# Patient Record
Sex: Female | Born: 1985 | Race: White | Hispanic: Yes | Marital: Married | Smoking: Never smoker
Health system: Southern US, Community
[De-identification: ages and names within clinical notes are randomized; demographics above are authoritative.]

## PROBLEM LIST (undated history)

## (undated) DIAGNOSIS — Z789 Other specified health status: Secondary | ICD-10-CM

## (undated) DIAGNOSIS — G510 Bell's palsy: Secondary | ICD-10-CM

## (undated) DIAGNOSIS — R519 Headache, unspecified: Secondary | ICD-10-CM

## (undated) HISTORY — PX: OTHER SURGICAL HISTORY: SHX169

## (undated) HISTORY — DX: Bell's palsy: G51.0

## (undated) HISTORY — PX: NO PAST SURGERIES: SHX2092

---

## 2010-05-20 ENCOUNTER — Ambulatory Visit (HOSPITAL_COMMUNITY): Admission: RE | Admit: 2010-05-20 | Discharge: 2010-05-20 | Payer: Self-pay | Admitting: Family Medicine

## 2010-09-19 ENCOUNTER — Inpatient Hospital Stay (HOSPITAL_COMMUNITY)
Admission: AD | Admit: 2010-09-19 | Discharge: 2010-09-20 | Payer: Self-pay | Source: Home / Self Care | Attending: Obstetrics & Gynecology | Admitting: Obstetrics & Gynecology

## 2010-09-29 LAB — URINE MICROSCOPIC-ADD ON

## 2010-09-29 LAB — URINALYSIS, ROUTINE W REFLEX MICROSCOPIC
Bilirubin Urine: NEGATIVE
Ketones, ur: NEGATIVE mg/dL
Nitrite: NEGATIVE
Protein, ur: NEGATIVE mg/dL
Specific Gravity, Urine: 1.01 (ref 1.005–1.030)
Urine Glucose, Fasting: NEGATIVE mg/dL
Urobilinogen, UA: 0.2 mg/dL (ref 0.0–1.0)
pH: 7 (ref 5.0–8.0)

## 2010-10-03 ENCOUNTER — Ambulatory Visit (HOSPITAL_COMMUNITY)
Admission: RE | Admit: 2010-10-03 | Discharge: 2010-10-03 | Payer: Self-pay | Source: Home / Self Care | Attending: Family Medicine | Admitting: Family Medicine

## 2010-10-11 ENCOUNTER — Inpatient Hospital Stay (HOSPITAL_COMMUNITY)
Admission: AD | Admit: 2010-10-11 | Discharge: 2010-10-14 | Payer: Self-pay | Source: Home / Self Care | Attending: Obstetrics & Gynecology | Admitting: Obstetrics & Gynecology

## 2010-10-11 LAB — CBC
HCT: 34.8 % — ABNORMAL LOW (ref 36.0–46.0)
Hemoglobin: 11.7 g/dL — ABNORMAL LOW (ref 12.0–15.0)
MCH: 28.6 pg (ref 26.0–34.0)
MCHC: 33.6 g/dL (ref 30.0–36.0)
MCV: 85.1 fL (ref 78.0–100.0)
Platelets: 112 10*3/uL — ABNORMAL LOW (ref 150–400)
RBC: 4.09 MIL/uL (ref 3.87–5.11)
RDW: 13.8 % (ref 11.5–15.5)
WBC: 6.6 10*3/uL (ref 4.0–10.5)

## 2010-10-12 LAB — ABO/RH: ABO/RH(D): O POS

## 2010-10-12 LAB — RPR: RPR Ser Ql: NONREACTIVE

## 2010-10-26 ENCOUNTER — Emergency Department (HOSPITAL_COMMUNITY)
Admission: EM | Admit: 2010-10-26 | Discharge: 2010-10-27 | Disposition: A | Discharge status: Home / Self Care | Payer: Self-pay | Attending: Emergency Medicine | Admitting: Emergency Medicine

## 2010-10-26 DIAGNOSIS — N390 Urinary tract infection, site not specified: Secondary | ICD-10-CM | POA: Insufficient documentation

## 2010-10-26 DIAGNOSIS — R1013 Epigastric pain: Secondary | ICD-10-CM | POA: Insufficient documentation

## 2010-10-26 DIAGNOSIS — R52 Pain, unspecified: Secondary | ICD-10-CM | POA: Insufficient documentation

## 2010-10-26 DIAGNOSIS — R112 Nausea with vomiting, unspecified: Secondary | ICD-10-CM | POA: Insufficient documentation

## 2010-10-26 DIAGNOSIS — R079 Chest pain, unspecified: Secondary | ICD-10-CM | POA: Insufficient documentation

## 2010-10-26 DIAGNOSIS — R10816 Epigastric abdominal tenderness: Secondary | ICD-10-CM | POA: Insufficient documentation

## 2010-10-26 DIAGNOSIS — M546 Pain in thoracic spine: Secondary | ICD-10-CM | POA: Insufficient documentation

## 2010-10-27 LAB — URINALYSIS, ROUTINE W REFLEX MICROSCOPIC
Bilirubin Urine: NEGATIVE
Ketones, ur: NEGATIVE mg/dL
Nitrite: NEGATIVE
Protein, ur: NEGATIVE mg/dL
Specific Gravity, Urine: 1.017 (ref 1.005–1.030)
Urine Glucose, Fasting: NEGATIVE mg/dL
Urobilinogen, UA: 1 mg/dL (ref 0.0–1.0)
pH: 7.5 (ref 5.0–8.0)

## 2010-10-27 LAB — BASIC METABOLIC PANEL
BUN: 13 mg/dL (ref 6–23)
CO2: 24 mEq/L (ref 19–32)
Calcium: 8.4 mg/dL (ref 8.4–10.5)
Chloride: 105 mEq/L (ref 96–112)
Creatinine, Ser: 0.58 mg/dL (ref 0.4–1.2)
GFR calc Af Amer: >60 mL/min (ref >60)
GFR calc non Af Amer: >60 mL/min (ref >60)
Glucose, Bld: 111 mg/dL — ABNORMAL HIGH (ref 70–99)
Potassium: 3.5 mEq/L (ref 3.5–5.1)
Sodium: 138 mEq/L (ref 135–145)

## 2010-10-27 LAB — HEPATIC FUNCTION PANEL
ALT: 40 U/L — ABNORMAL HIGH (ref 0–35)
AST: 84 U/L — ABNORMAL HIGH (ref 0–37)
Albumin: 3.3 g/dL — ABNORMAL LOW (ref 3.5–5.2)
Alkaline Phosphatase: 111 U/L (ref 39–117)
Bilirubin, Direct: 0.2 mg/dL (ref 0.0–0.3)
Indirect Bilirubin: 0.2 mg/dL — ABNORMAL LOW (ref 0.3–0.9)
Total Bilirubin: 0.4 mg/dL (ref 0.3–1.2)
Total Protein: 6.7 g/dL (ref 6.0–8.3)

## 2010-10-27 LAB — DIFFERENTIAL
Basophils Absolute: 0 10*3/uL (ref 0.0–0.1)
Basophils Relative: 0 % (ref 0–1)
Eosinophils Absolute: 0.1 10*3/uL (ref 0.0–0.7)
Eosinophils Relative: 1 % (ref 0–5)
Lymphocytes Relative: 13 % (ref 12–46)
Lymphs Abs: 1.5 10*3/uL (ref 0.7–4.0)
Monocytes Absolute: 0.5 10*3/uL (ref 0.1–1.0)
Monocytes Relative: 4 % (ref 3–12)
Neutro Abs: 9.1 10*3/uL — ABNORMAL HIGH (ref 1.7–7.7)
Neutrophils Relative %: 81 % — ABNORMAL HIGH (ref 43–77)

## 2010-10-27 LAB — CBC
HCT: 37.2 % (ref 36.0–46.0)
Hemoglobin: 12.5 g/dL (ref 12.0–15.0)
MCH: 28.7 pg (ref 26.0–34.0)
MCHC: 33.6 g/dL (ref 30.0–36.0)
MCV: 85.5 fL (ref 78.0–100.0)
Platelets: 162 10*3/uL (ref 150–400)
RBC: 4.35 MIL/uL (ref 3.87–5.11)
RDW: 13.2 % (ref 11.5–15.5)
WBC: 11.2 10*3/uL — ABNORMAL HIGH (ref 4.0–10.5)

## 2010-10-27 LAB — URINE MICROSCOPIC-ADD ON

## 2010-10-27 LAB — LIPASE, BLOOD: Lipase: 39 U/L (ref 11–59)

## 2010-10-27 LAB — PREGNANCY, URINE: Preg Test, Ur: NEGATIVE

## 2010-10-27 LAB — D-DIMER, QUANTITATIVE: D-Dimer, Quant: 0.28 ug/mL-FEU (ref 0.00–0.48)

## 2010-12-23 ENCOUNTER — Emergency Department (HOSPITAL_COMMUNITY): Payer: Self-pay

## 2010-12-23 ENCOUNTER — Emergency Department (HOSPITAL_COMMUNITY)
Admission: EM | Admit: 2010-12-23 | Discharge: 2010-12-23 | Disposition: A | Discharge status: Home / Self Care | Payer: Self-pay | Attending: Emergency Medicine | Admitting: Emergency Medicine

## 2010-12-23 ENCOUNTER — Encounter (HOSPITAL_COMMUNITY): Payer: Self-pay | Admitting: Radiology

## 2010-12-23 DIAGNOSIS — R112 Nausea with vomiting, unspecified: Secondary | ICD-10-CM | POA: Insufficient documentation

## 2010-12-23 DIAGNOSIS — R1011 Right upper quadrant pain: Secondary | ICD-10-CM | POA: Insufficient documentation

## 2010-12-23 DIAGNOSIS — M549 Dorsalgia, unspecified: Secondary | ICD-10-CM | POA: Insufficient documentation

## 2010-12-23 DIAGNOSIS — R799 Abnormal finding of blood chemistry, unspecified: Secondary | ICD-10-CM | POA: Insufficient documentation

## 2010-12-23 LAB — URINALYSIS, ROUTINE W REFLEX MICROSCOPIC
Bilirubin Urine: NEGATIVE
Glucose, UA: NEGATIVE mg/dL
Hgb urine dipstick: NEGATIVE
Ketones, ur: NEGATIVE mg/dL
Nitrite: NEGATIVE
Protein, ur: NEGATIVE mg/dL
Specific Gravity, Urine: 1.021 (ref 1.005–1.030)
Urobilinogen, UA: 1 mg/dL (ref 0.0–1.0)
pH: 8 (ref 5.0–8.0)

## 2010-12-23 LAB — COMPREHENSIVE METABOLIC PANEL
ALT: 200 U/L — ABNORMAL HIGH (ref 0–35)
AST: 384 U/L — ABNORMAL HIGH (ref 0–37)
Albumin: 4 g/dL (ref 3.5–5.2)
Alkaline Phosphatase: 103 U/L (ref 39–117)
BUN: 15 mg/dL (ref 6–23)
CO2: 24 mEq/L (ref 19–32)
Calcium: 9 mg/dL (ref 8.4–10.5)
Chloride: 106 mEq/L (ref 96–112)
Creatinine, Ser: 0.45 mg/dL (ref 0.4–1.2)
GFR calc Af Amer: >60 mL/min (ref >60)
GFR calc non Af Amer: >60 mL/min (ref >60)
Glucose, Bld: 95 mg/dL (ref 70–99)
Potassium: 3.9 mEq/L (ref 3.5–5.1)
Sodium: 135 mEq/L (ref 135–145)
Total Bilirubin: 0.7 mg/dL (ref 0.3–1.2)
Total Protein: 7.8 g/dL (ref 6.0–8.3)

## 2010-12-23 LAB — CBC
HCT: 35.6 % — ABNORMAL LOW (ref 36.0–46.0)
Hemoglobin: 11.9 g/dL — ABNORMAL LOW (ref 12.0–15.0)
MCH: 27.8 pg (ref 26.0–34.0)
MCHC: 33.4 g/dL (ref 30.0–36.0)
MCV: 83.2 fL (ref 78.0–100.0)
Platelets: 174 10*3/uL (ref 150–400)
RBC: 4.28 MIL/uL (ref 3.87–5.11)
RDW: 12.9 % (ref 11.5–15.5)
WBC: 6.1 10*3/uL (ref 4.0–10.5)

## 2010-12-23 LAB — DIFFERENTIAL
Basophils Absolute: 0 10*3/uL (ref 0.0–0.1)
Basophils Relative: 0 % (ref 0–1)
Eosinophils Absolute: 0 10*3/uL (ref 0.0–0.7)
Eosinophils Relative: 1 % (ref 0–5)
Lymphocytes Relative: 31 % (ref 12–46)
Lymphs Abs: 1.9 10*3/uL (ref 0.7–4.0)
Monocytes Absolute: 0.4 10*3/uL (ref 0.1–1.0)
Monocytes Relative: 7 % (ref 3–12)
Neutro Abs: 3.7 10*3/uL (ref 1.7–7.7)
Neutrophils Relative %: 61 % (ref 43–77)

## 2010-12-23 LAB — URINE MICROSCOPIC-ADD ON

## 2010-12-23 LAB — POCT PREGNANCY, URINE: Preg Test, Ur: NEGATIVE

## 2010-12-23 LAB — LIPASE, BLOOD: Lipase: 29 U/L (ref 11–59)

## 2010-12-23 MED ORDER — IOHEXOL 300 MG/ML  SOLN
95.0000 mL | Freq: Once | INTRAMUSCULAR | Status: AC | PRN
  Administered 2010-12-23: 95 mL via INTRAVENOUS

## 2010-12-25 ENCOUNTER — Other Ambulatory Visit: Payer: Medicaid Other

## 2010-12-25 ENCOUNTER — Other Ambulatory Visit: Payer: Self-pay | Admitting: Obstetrics & Gynecology

## 2010-12-25 DIAGNOSIS — Z0189 Encounter for other specified special examinations: Secondary | ICD-10-CM

## 2010-12-25 LAB — POCT URINALYSIS DIP (DEVICE)
Glucose, UA: NEGATIVE mg/dL
Nitrite: NEGATIVE
Urobilinogen, UA: 4 mg/dL — ABNORMAL HIGH (ref 0.0–1.0)

## 2011-01-23 ENCOUNTER — Ambulatory Visit (INDEPENDENT_AMBULATORY_CARE_PROVIDER_SITE_OTHER): Payer: Medicaid Other | Admitting: Family Medicine

## 2011-01-23 ENCOUNTER — Encounter: Payer: Self-pay | Admitting: Family Medicine

## 2011-01-23 DIAGNOSIS — R7401 Elevation of levels of liver transaminase levels: Secondary | ICD-10-CM | POA: Insufficient documentation

## 2011-01-23 DIAGNOSIS — R1013 Epigastric pain: Secondary | ICD-10-CM

## 2011-01-23 LAB — POCT H PYLORI SCREEN: H Pylori Screen, POC: POSITIVE

## 2011-01-23 MED ORDER — OMEPRAZOLE 20 MG PO CPDR: 20.0000 mg | DELAYED_RELEASE_CAPSULE | Freq: Every day | ORAL | Status: DC

## 2011-01-23 NOTE — Assessment & Plan Note (Signed)
Problem noted several months ago. Will check viral hepatitis labs, transferrin, and repeat LFTs. Abdominal US was negative for hepatic abnormalities. Possibly related to medication, pregnancy, steatosis. Will trend values today and consider further testing if not improved.

## 2011-01-23 NOTE — Assessment & Plan Note (Signed)
Possible GERD vs gastritis vs ulcer disease given chronic nature and early satiety. Extensive abdominal imaging and lipase were normal in ED, making pancreatic inflammation and gallstones less likely despite transient transaminitis. No weight loss, hematemesis or other red flag symptoms. Will test for H. Pylori and treat symptomatically with PPI as patient has not tried any medication thus far. Will use agents safe with lactation.

## 2011-01-23 NOTE — Patient Instructions (Signed)
Nice to see you again.  I will call you if your labs are abnormal. Try taking omeprazole once daily. You may also try tums from the drugstore for your stomach pain. Make an appointment if your symptoms get worse or do not improve.

## 2011-01-23 NOTE — Progress Notes (Signed)
  Subjective:    Patient ID: Roberta Nelson, female    DOB: 17-Mar-1986, 25 y.o.   MRN: [id number]  HPI New patient referred from [facility] due to persistent elevated LFTs and glucose intolerance. Gave birth January 2012.  1. Epigastric pain. Present since before pregnancy. Radiates to back, not present constantly or everyday. Intermittent. Not associated with meals, but does endorse early satiety. Had emesis one time, nonbloody. Records and labs reviewed: Presented to ED on 12/23/10 for this pain and had elevated LFTs but normal CBC and UA. Normal abdominal US, CT abdominal/pelvis and chest XR all negative except for significant stool burden. No signs biliary or hepatic abnormality.   2. Glucose intolerance. No history of diabetes. Forwarded medical record refers to elevated fasting blood glucose; however I see a recent fasting level of 96. No fam hx or personal hx of diabetes. Likely this has resolved postpartum.  3. Elevated LFTs. On prenatal testing, mildly elevated AST and ALT (84, 40). Was seen in ED 12/23/10 for right back and abdominal pain and CMP showed AST 384 and ALT 200, normal AP and bili. Pt denies taking medications or any history of hepatitis.     Review of Systems See HPI. Denies hematesis, hematochezia, CP, SOB, cough, constipation, n/d.     Objective:   Physical Exam  Vitals reviewed. Constitutional: She is oriented to person, place, and time. She appears well-developed and well-nourished. No distress.  HENT:  Head: Normocephalic and atraumatic.  Mouth/Throat: Oropharynx is clear and moist. No oropharyngeal exudate.  Cardiovascular: Normal rate, regular rhythm and normal heart sounds.   Pulmonary/Chest: Effort normal.  Abdominal: Soft. Bowel sounds are normal. She exhibits no distension. There is tenderness. There is no rebound and no guarding.       Mild Epigastric tenderness.   Neurological: She is alert and oriented to person, place, and time.  Coordination normal.          Assessment & Plan:

## 2011-01-24 LAB — COMPREHENSIVE METABOLIC PANEL
ALT: 16 U/L (ref 0–35)
CO2: 21 mEq/L (ref 19–32)
Calcium: 8.9 mg/dL (ref 8.4–10.5)
Chloride: 105 mEq/L (ref 96–112)
Creat: 0.61 mg/dL (ref 0.40–1.20)
Glucose, Bld: 92 mg/dL (ref 70–99)
Sodium: 139 mEq/L (ref 135–145)
Total Protein: 7.6 g/dL (ref 6.0–8.3)

## 2011-01-24 LAB — HEPATITIS C ANTIBODY: HCV Ab: NEGATIVE

## 2011-01-24 LAB — TRANSFERRIN: Transferrin: 289 mg/dL (ref 212–360)

## 2011-01-26 ENCOUNTER — Telehealth: Payer: Self-pay | Admitting: *Deleted

## 2011-01-26 MED ORDER — AMOXICILLIN 500 MG PO CAPS: 1000.0000 mg | ORAL_CAPSULE | Freq: Two times a day (BID) | ORAL | Status: AC

## 2011-01-26 MED ORDER — CLARITHROMYCIN 500 MG PO TABS: 500.0000 mg | ORAL_TABLET | Freq: Two times a day (BID) | ORAL | Status: AC

## 2011-01-26 NOTE — Progress Notes (Signed)
Addended by: Lloyd Huger on: 01/26/2011 09:32 AM   Modules accepted: Orders

## 2011-01-26 NOTE — Telephone Encounter (Signed)
Message copied by Jimmy Footman on Mon Jan 26, 2011  3:48 PM ------      Message from: Lloyd Huger      Created: Mon Jan 26, 2011  9:32 AM      Regarding: Need interpretation of results       Please call patient and tell her she tested positive for bacteria H. Pylori which is causing her stomach pain. I sent antibiotic prescriptions to [facility] for 10 day course. Safe in lactation. Follow up if pain persists.

## 2011-01-26 NOTE — Progress Notes (Signed)
Tested positive on H. Pylori testing. Will treat with triple therapy including PPI, amoxicillin and clarithromycin for 10 days. Prescriptions sent to pharmacy [facility] on [address]. Safe in lactation.

## 2011-01-26 NOTE — Telephone Encounter (Signed)
Miranes, Can you please call this patient and inform of below.  Amoxicillin sent in to [facility] on [address] ---Talajah Slimp

## 2012-04-23 IMAGING — CR DG ABDOMEN ACUTE W/ 1V CHEST
3 series · 3 of 3 positions shown · non-contrast
Comparison: None.

CLINICAL DATA: Nausea, vomiting, abdominal pain for 3 days

ACUTE ABDOMEN SERIES (ABDOMEN 2 VIEW & CHEST 1 VIEW)

[w chest pa]
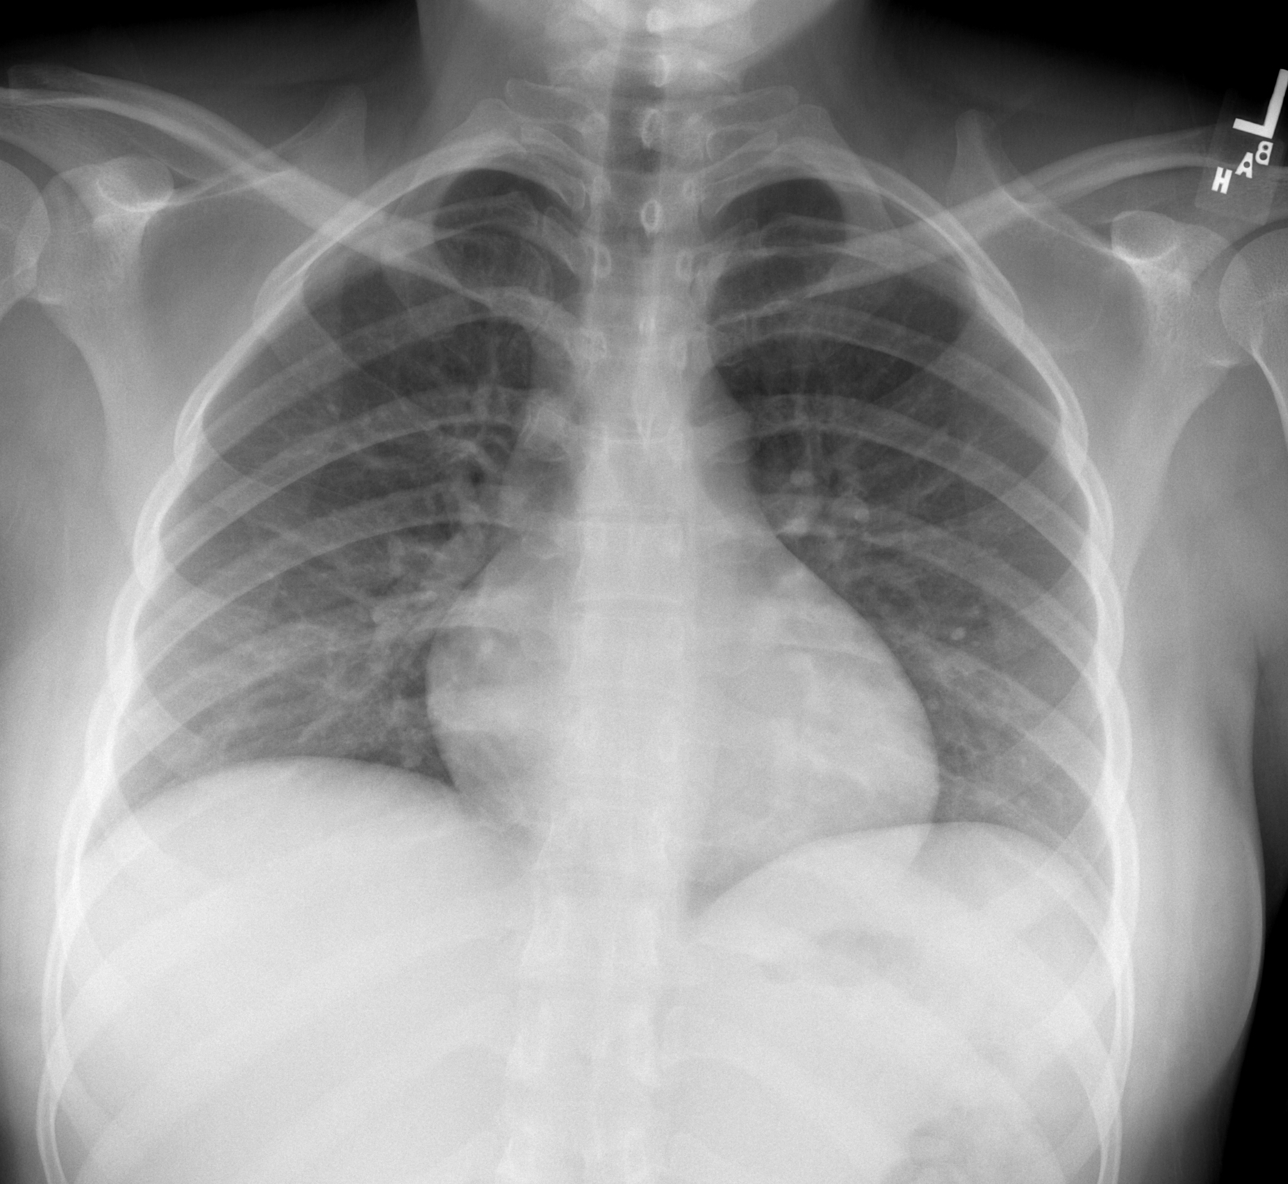

[w abdomen upright]
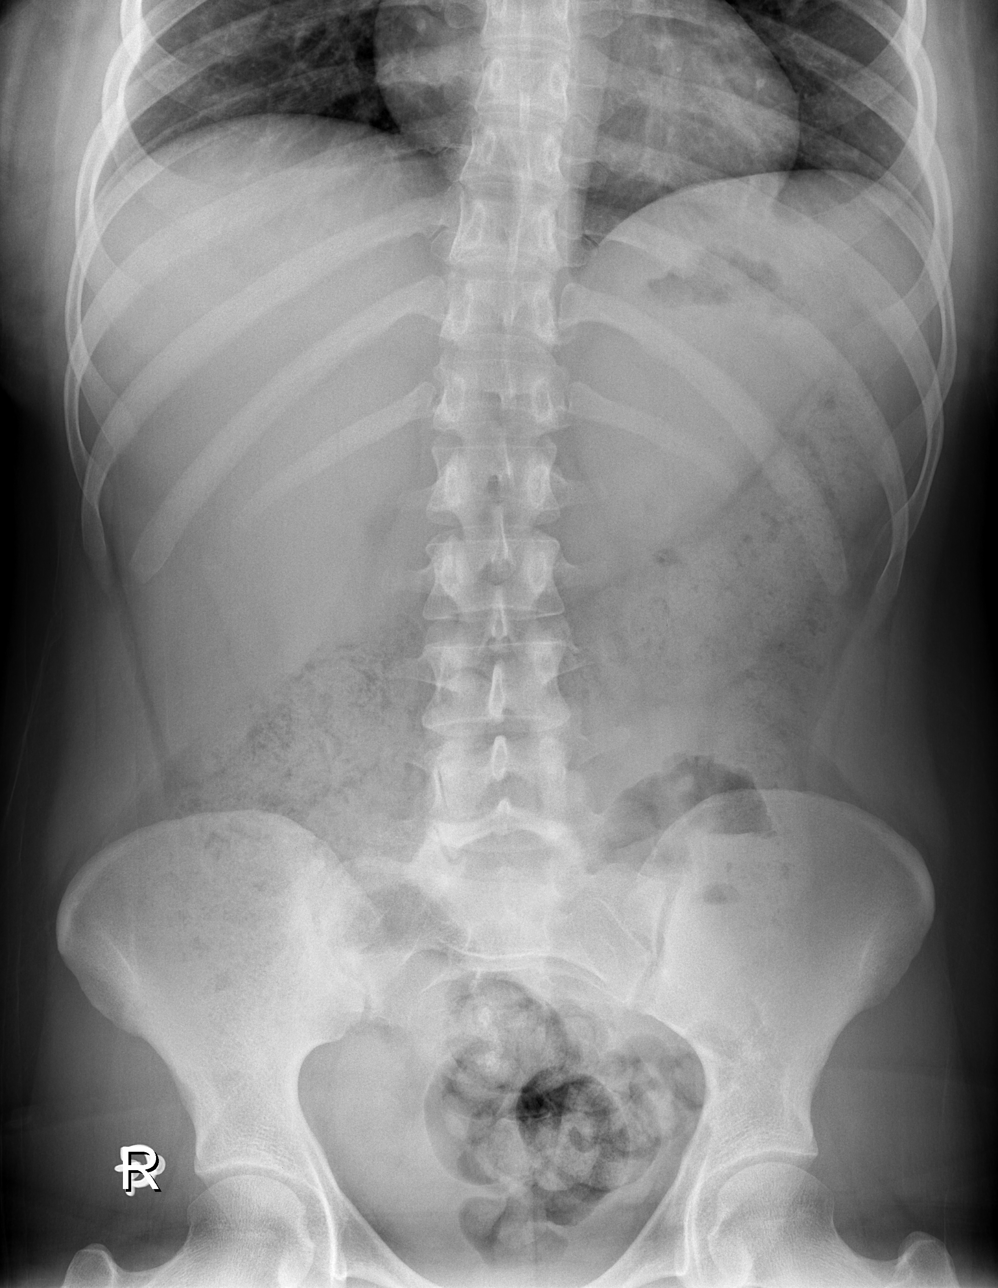

[t abdomen supine]
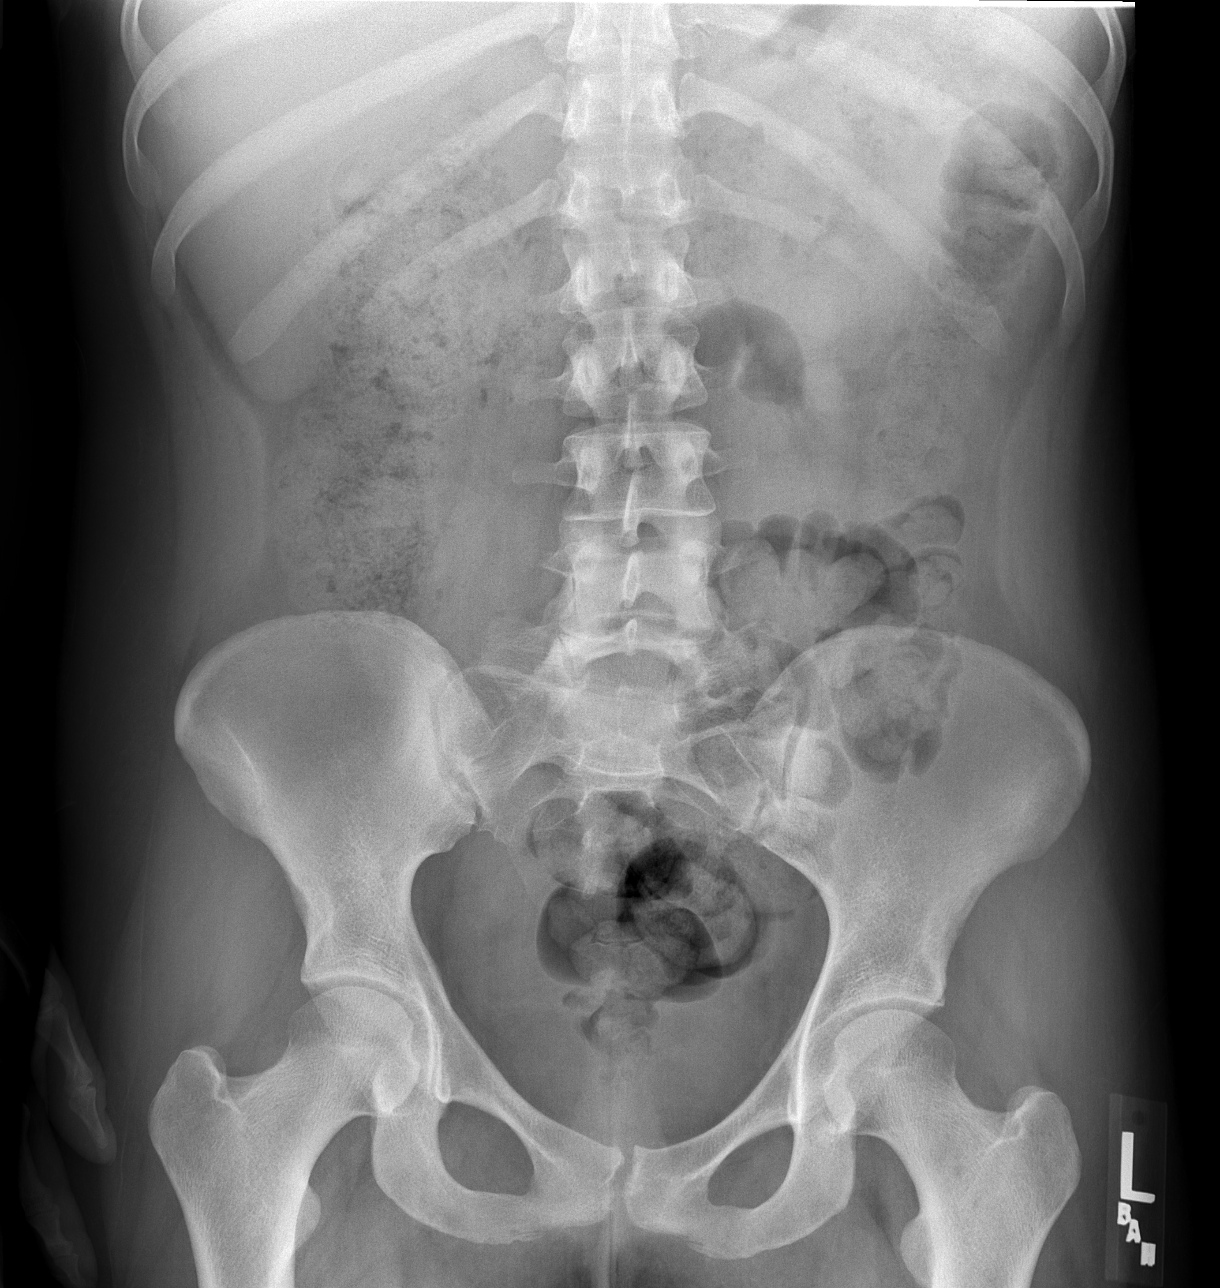

[3 of 3 positions shown; findings below may reference images not displayed]

FINDINGS: The lungs are clear.  Mediastinal contours appear normal.
The heart is within normal limits in size.

Supine and erect views of the abdomen show a moderate to large
amount of feces throughout the colon.  No bowel obstruction is
seen.  No free air is noted.  No opaque calculi are noted.  The
bones appear normal.
IMPRESSION: 1.  No active lung disease.
2.  Moderate to large amount of feces throughout the colon.  No
bowel obstruction.

## 2017-04-07 ENCOUNTER — Other Ambulatory Visit (HOSPITAL_COMMUNITY): Payer: Self-pay | Admitting: Nurse Practitioner

## 2017-04-07 DIAGNOSIS — Z369 Encounter for antenatal screening, unspecified: Secondary | ICD-10-CM

## 2017-04-08 ENCOUNTER — Encounter (HOSPITAL_COMMUNITY): Payer: Self-pay | Admitting: *Deleted

## 2017-04-09 ENCOUNTER — Other Ambulatory Visit (HOSPITAL_COMMUNITY): Payer: Self-pay | Admitting: Nurse Practitioner

## 2017-04-09 ENCOUNTER — Ambulatory Visit (HOSPITAL_COMMUNITY)
Admission: RE | Admit: 2017-04-09 | Discharge: 2017-04-09 | Disposition: A | Discharge status: Home / Self Care | Payer: Self-pay | Source: Ambulatory Visit | Attending: Nurse Practitioner | Admitting: Nurse Practitioner

## 2017-04-09 ENCOUNTER — Encounter (HOSPITAL_COMMUNITY): Payer: Self-pay

## 2017-04-09 DIAGNOSIS — Z3A11 11 weeks gestation of pregnancy: Secondary | ICD-10-CM | POA: Insufficient documentation

## 2017-04-09 DIAGNOSIS — Z3682 Encounter for antenatal screening for nuchal translucency: Secondary | ICD-10-CM | POA: Insufficient documentation

## 2017-04-09 DIAGNOSIS — Z369 Encounter for antenatal screening, unspecified: Secondary | ICD-10-CM

## 2017-04-09 HISTORY — DX: Other specified health status: Z78.9

## 2017-04-14 ENCOUNTER — Other Ambulatory Visit: Payer: Self-pay

## 2017-09-14 NOTE — L&D Delivery Note (Signed)
Delivery Note At 2:26 PM a viable female was delivered via Vaginal, Spontaneous (Presentation:vertex ; OP ).  APGAR: 9, 9; weight pending  .   Placenta status: delivered intact .  Cord:3V, tight nuchal  with the following complications: none .  Cord pH: N/A  Anesthesia:  none Episiotomy: None Lacerations:  Perineal  Suture Repair: 3.0 Est. Blood Loss (mL):  150  Mom to postpartum.  Baby to Couplet care / Skin to Skin.  Tanequa Kretz 10/20/2017, 2:45 PM

## 2017-10-20 ENCOUNTER — Encounter (HOSPITAL_COMMUNITY): Payer: Self-pay | Admitting: Emergency Medicine

## 2017-10-20 ENCOUNTER — Inpatient Hospital Stay (HOSPITAL_COMMUNITY)
Admission: AD | Admit: 2017-10-20 | Discharge: 2017-10-22 | DRG: 807 | Disposition: A | Discharge status: Home / Self Care | Payer: Medicaid Other | Source: Ambulatory Visit | Attending: Family Medicine | Admitting: Family Medicine

## 2017-10-20 DIAGNOSIS — Z3A40 40 weeks gestation of pregnancy: Secondary | ICD-10-CM

## 2017-10-20 DIAGNOSIS — Z3483 Encounter for supervision of other normal pregnancy, third trimester: Secondary | ICD-10-CM | POA: Diagnosis present

## 2017-10-20 DIAGNOSIS — D649 Anemia, unspecified: Secondary | ICD-10-CM | POA: Diagnosis present

## 2017-10-20 DIAGNOSIS — O9902 Anemia complicating childbirth: Secondary | ICD-10-CM | POA: Diagnosis present

## 2017-10-20 LAB — CBC
HEMATOCRIT: 36 % (ref 36.0–46.0)
HEMOGLOBIN: 12.2 g/dL (ref 12.0–15.0)
MCH: 29.1 pg (ref 26.0–34.0)
MCHC: 33.9 g/dL (ref 30.0–36.0)
MCV: 85.9 fL (ref 78.0–100.0)
Platelets: 180 10*3/uL (ref 150–400)
RBC: 4.19 MIL/uL (ref 3.87–5.11)
RDW: 13.7 % (ref 11.5–15.5)
WBC: 7.4 10*3/uL (ref 4.0–10.5)

## 2017-10-20 LAB — RPR: RPR: NONREACTIVE

## 2017-10-20 LAB — TYPE AND SCREEN
ABO/RH(D): O POS
Antibody Screen: NEGATIVE

## 2017-10-20 MED ORDER — COCONUT OIL OIL
1.0000 "application " | TOPICAL_OIL | Status: DC | PRN
  Administered 2017-10-21: 1 via TOPICAL
  Filled 2017-10-20: qty 120

## 2017-10-20 MED ORDER — FENTANYL CITRATE (PF) 100 MCG/2ML IJ SOLN
100.0000 ug | INTRAMUSCULAR | Status: DC | PRN
  Administered 2017-10-20: 100 ug via INTRAVENOUS
  Filled 2017-10-20: qty 2

## 2017-10-20 MED ORDER — LIDOCAINE HCL (PF) 1 % IJ SOLN
30.0000 mL | INTRAMUSCULAR | Status: DC | PRN
  Administered 2017-10-20: 30 mL via SUBCUTANEOUS
  Filled 2017-10-20: qty 30

## 2017-10-20 MED ORDER — OXYTOCIN 40 UNITS IN LACTATED RINGERS INFUSION - SIMPLE MED
1.0000 m[IU]/min | INTRAVENOUS | Status: DC
  Administered 2017-10-20: 2 m[IU]/min via INTRAVENOUS

## 2017-10-20 MED ORDER — ONDANSETRON HCL 4 MG/2ML IJ SOLN: 4.0000 mg | Freq: Four times a day (QID) | INTRAMUSCULAR | Status: DC | PRN

## 2017-10-20 MED ORDER — ACETAMINOPHEN 325 MG PO TABS
650.0000 mg | ORAL_TABLET | ORAL | Status: DC | PRN
  Administered 2017-10-20: 650 mg via ORAL
  Filled 2017-10-20: qty 2

## 2017-10-20 MED ORDER — OXYCODONE-ACETAMINOPHEN 5-325 MG PO TABS: 1.0000 | ORAL_TABLET | ORAL | Status: DC | PRN

## 2017-10-20 MED ORDER — SIMETHICONE 80 MG PO CHEW: 80.0000 mg | CHEWABLE_TABLET | ORAL | Status: DC | PRN

## 2017-10-20 MED ORDER — OXYTOCIN 40 UNITS IN LACTATED RINGERS INFUSION - SIMPLE MED
2.5000 [IU]/h | INTRAVENOUS | Status: DC
  Filled 2017-10-20: qty 1000

## 2017-10-20 MED ORDER — SENNOSIDES-DOCUSATE SODIUM 8.6-50 MG PO TABS
2.0000 | ORAL_TABLET | ORAL | Status: DC
  Administered 2017-10-21 (×2): 2 via ORAL
  Filled 2017-10-20 (×2): qty 2

## 2017-10-20 MED ORDER — DIPHENHYDRAMINE HCL 25 MG PO CAPS: 25.0000 mg | ORAL_CAPSULE | Freq: Four times a day (QID) | ORAL | Status: DC | PRN

## 2017-10-20 MED ORDER — WITCH HAZEL-GLYCERIN EX PADS: 1.0000 "application " | MEDICATED_PAD | CUTANEOUS | Status: DC | PRN

## 2017-10-20 MED ORDER — DIBUCAINE 1 % RE OINT: 1.0000 "application " | TOPICAL_OINTMENT | RECTAL | Status: DC | PRN

## 2017-10-20 MED ORDER — BENZOCAINE-MENTHOL 20-0.5 % EX AERO: 1.0000 "application " | INHALATION_SPRAY | CUTANEOUS | Status: DC | PRN

## 2017-10-20 MED ORDER — LACTATED RINGERS IV SOLN: 500.0000 mL | INTRAVENOUS | Status: DC | PRN

## 2017-10-20 MED ORDER — ONDANSETRON HCL 4 MG PO TABS: 4.0000 mg | ORAL_TABLET | ORAL | Status: DC | PRN

## 2017-10-20 MED ORDER — ACETAMINOPHEN 325 MG PO TABS: 650.0000 mg | ORAL_TABLET | ORAL | Status: DC | PRN

## 2017-10-20 MED ORDER — OXYTOCIN BOLUS FROM INFUSION
500.0000 mL | Freq: Once | INTRAVENOUS | Status: AC
  Administered 2017-10-20: 500 mL via INTRAVENOUS

## 2017-10-20 MED ORDER — LACTATED RINGERS IV SOLN
INTRAVENOUS | Status: DC
  Administered 2017-10-20 (×2): via INTRAVENOUS

## 2017-10-20 MED ORDER — SOD CITRATE-CITRIC ACID 500-334 MG/5ML PO SOLN: 30.0000 mL | ORAL | Status: DC | PRN

## 2017-10-20 MED ORDER — ONDANSETRON HCL 4 MG/2ML IJ SOLN: 4.0000 mg | INTRAMUSCULAR | Status: DC | PRN

## 2017-10-20 MED ORDER — TETANUS-DIPHTH-ACELL PERTUSSIS 5-2.5-18.5 LF-MCG/0.5 IM SUSP: 0.5000 mL | Freq: Once | INTRAMUSCULAR | Status: DC

## 2017-10-20 MED ORDER — PRENATAL MULTIVITAMIN CH
1.0000 | ORAL_TABLET | Freq: Every day | ORAL | Status: DC
  Administered 2017-10-21: 1 via ORAL
  Filled 2017-10-20: qty 1

## 2017-10-20 MED ORDER — IBUPROFEN 600 MG PO TABS
600.0000 mg | ORAL_TABLET | Freq: Four times a day (QID) | ORAL | Status: DC
  Administered 2017-10-20 – 2017-10-22 (×7): 600 mg via ORAL
  Filled 2017-10-20 (×7): qty 1

## 2017-10-20 MED ORDER — TERBUTALINE SULFATE 1 MG/ML IJ SOLN
0.2500 mg | Freq: Once | INTRAMUSCULAR | Status: DC | PRN
  Filled 2017-10-20: qty 1

## 2017-10-20 MED ORDER — OXYCODONE-ACETAMINOPHEN 5-325 MG PO TABS
2.0000 | ORAL_TABLET | ORAL | Status: DC | PRN
  Administered 2017-10-20: 2 via ORAL
  Filled 2017-10-20: qty 2

## 2017-10-20 NOTE — MAU Note (Signed)
Pt c/o ctx that started at 3am. Pt denies bleeding &LOF. +FM

## 2017-10-20 NOTE — MAU Note (Signed)
Pt reports contractions that started around 2 am and now every 5 mins. Pt denies LOF or vaginal bleeding. Reports good fetal movement.

## 2017-10-20 NOTE — Anesthesia Pain Management Evaluation Note (Signed)
  CRNA Pain Management Visit Note  Patient: Roberta Nelson Vaquier, 32 y.o., female  "Hello I am a member of the anesthesia team at [facility]. We have an anesthesia team available at all times to provide care throughout the hospital, including epidural management and anesthesia for C-section. I don't know your plan for the delivery whether it a natural birth, water birth, IV sedation, nitrous supplementation, doula or epidural, but we want to meet your pain goals."   1.Was your pain managed to your expectations on prior hospitalizations?   Yes   2.What is your expectation for pain management during this hospitalization?     IV pain meds  3.How can we help you reach that goal?IV meds  Record the patient's initial score and the patient's pain goal.   Pain: 7  Pain Goal: 10 [facility] wants you to be able to say your pain was always managed very well.  Cleda ClarksBrowder, Nixie Laube R 10/20/2017

## 2017-10-20 NOTE — Progress Notes (Signed)
Roberta Nelson is a 32 y.o. G2P1001 at 3276w4d by LMP admitted for active labor  Subjective: Patient tolerating pain well no pain medication at this time.   Objective: BP 113/68   Pulse 65   Temp 97.7 F (36.5 C) (Oral)   Resp 16   Ht 4\' 9"  (1.448 m)   Wt 151 lb (68.5 kg)   LMP 01/09/2017   BMI 32.68 kg/m  No intake/output data recorded. No intake/output data recorded.  FHT:  FHR: 135 bpm, variability: moderate,  accelerations:  Present,  decelerations:  Absent UC:   regular, every 3 minutes SVE:   Dilation: (no change) Effacement (%): 70 Station: -1 Exam by:: Campbell SoupWinborne  Labs: Lab Results  Component Value Date   WBC 7.4 10/20/2017   HGB 12.2 10/20/2017   HCT 36.0 10/20/2017   MCV 85.9 10/20/2017   PLT 180 10/20/2017    Assessment / Plan: Spontaneous labor, progressing normally  Labor: Progressing on Pitocin, will continue to increase then AROM, AROM at 11:50 clear fluid Preeclampsia:  no signs or symptoms of toxicity Fetal Wellbeing:  Category I Pain Control:  per patient request  I/D:  n/a Anticipated MOD:  NSVD  Roberta Nelson 10/20/2017, 11:55 AM

## 2017-10-20 NOTE — H&P (Signed)
LABOR AND DELIVERY ADMISSION HISTORY AND PHYSICAL NOTE  Roberta Nelson is a 32 y.o. female G2P1001 with IUP at [redacted]w[redacted]d by LMP presenting for SOL. Reports contractions every 10 min apart at home.  She reports positive fetal movement. She denies leakage of fluid or vaginal bleeding.  Prenatal History/Complications: PNC at [facility] Pregnancy complications:  - anemia   Sono: @[redacted]w[redacted]d , CWD, normal anatomy, cephalic presentation, posterior placenta, 908.3g, 48.2% EFW  Past Medical History: Past Medical History:  Diagnosis Date  . Medical history non-contributory     Past Surgical History: Past Surgical History:  Procedure Laterality Date  . NO PAST SURGERIES      Obstetrical History: OB History    Gravida Para Term Preterm AB Living   2 1 1     1    SAB TAB Ectopic Multiple Live Births                  Social History: Social History   Socioeconomic History  . Marital status: Married    Spouse name: None  . Number of children: None  . Years of education: None  . Highest education level: None  Social Needs  . Financial resource strain: None  . Food insecurity - worry: None  . Food insecurity - inability: None  . Transportation needs - medical: None  . Transportation needs - non-medical: None  Occupational History  . None  Tobacco Use  . Smoking status: Never Smoker  . Smokeless tobacco: Never Used  Substance and Sexual Activity  . Alcohol use: No  . Drug use: No  . Sexual activity: Yes    Birth control/protection: None  Other Topics Concern  . None  Social History Narrative  . None    Family History: Family History  Problem Relation Age of Onset  . Cancer Mother        endometrial  . Heart disease Maternal Grandmother     Allergies: No Known Allergies  Medications Prior to Admission  Medication Sig Dispense Refill Last Dose  . calcium carbonate (TUMS - DOSED IN MG ELEMENTAL CALCIUM) 500 MG chewable tablet Chew 1 tablet by mouth daily.   Taking   . omeprazole (PRILOSEC) 20 MG capsule Take 1 capsule (20 mg total) by mouth daily. 30 capsule 1   . Prenatal Vit w/Fe-Methylfol-FA (PNV PO) Take by mouth.   Taking     Review of Systems  All systems reviewed and negative except as stated in HPI  Physical Exam Blood pressure 127/80, pulse 82, temperature 97.8 F (36.6 C), temperature source Oral, resp. rate 17, height 4\' 9"  (1.448 m), weight 68.5 kg (151 lb), last menstrual period 01/09/2017. General appearance: alert, oriented, NAD Lungs: normal respiratory effort Heart: regular rate Abdomen: soft, non-tender; gravid, FH appropriate for GA Extremities: No calf swelling or tenderness Presentation: cephalic per MAU exam  Fetal monitoring: FHR 120, moderate variability, +accel, -decel  Uterine activity: irregular, every 3-48min  Dilation: 4.5 Effacement (%): 70 Station: -1 Exam by:: debra callaway, RN   Prenatal labs: ABO, Rh:  O Pos  Antibody:  Neg Rubella:  Immune RPR:   Neg  HBsAg:   Neg  HIV:   Non reactive  GC/Chlamydia: Neg GBS:   Neg  1-hr GTT: 112 Genetic screening:  CF neg , AFP neg, First Screen Neg  Anatomy US: normal   Prenatal Transfer Tool  Maternal Diabetes: No Genetic Screening: Normal Maternal Ultrasounds/Referrals: Normal Fetal Ultrasounds or other Referrals:  None Maternal Substance Abuse:  No Significant  Maternal Medications:  None Significant Maternal Lab Results: Lab values include: Group B Strep negative  No results found for this or any previous visit (from the past 24 hour(s)).  Patient Active Problem List   Diagnosis Date Noted  . Normal labor 10/20/2017  . Transaminitis 01/23/2011  . Epigastric pain 01/23/2011    Assessment: Roberta Nelson is a 32 y.o. G2P1001 at 1969w4d here for SOL  #Labor: anticipate NSVD, expectant management  #Pain: Per patient request, would like to try natural but open to IV pain medication if needed  #FWB: Category 1 #ID:  GBS neg  #MOF:  breast #MOC:BTL vs. Nexplanon  #Circ:  N/A  Oralia ManisSherin Abraham, DO PGY-1 10/20/2017, 6:46 AM  I confirm that I have verified the information documented in the resident's note and that I have also personally reperformed the physical exam and all medical decision making activities.  The patient was seen and examined by me also Agree with note NST reactive and reassuring UCs as listed Cervical exams as listed in note Admit for delivery Aviva SignsWilliams, Marie L, CNM

## 2017-10-20 NOTE — Progress Notes (Addendum)
Roberta Nelson is a 32 y.o. G2P1001 at 4157w4d by LMP admitted for active labor  Subjective: Patient doing well. Contractions about every 4 min. Epidural in place.  Objective: BP 109/63   Pulse 64   Temp 97.8 F (36.6 C) (Oral)   Resp 18   Ht 4\' 9"  (1.448 m)   Wt 151 lb (68.5 kg)   LMP 01/09/2017   BMI 32.68 kg/m  No intake/output data recorded. No intake/output data recorded.  FHT:  FHR: 130 bpm, variability: moderate,  accelerations:  Present,  decelerations:  Absent UC:   regular, every 3-6 minutes SVE:   Dilation: 4.5 Effacement (%): 70 Station: -1 Exam by:: Roberta callaway, RN   Labs: Lab Results  Component Value Date   WBC 7.4 10/20/2017   HGB 12.2 10/20/2017   HCT 36.0 10/20/2017   MCV 85.9 10/20/2017   PLT 180 10/20/2017    Assessment / Plan: Spontaneous labor, progressing normally  Labor: progressing normally  Preeclampsia:  n/a Fetal Wellbeing:  Category I Pain Control:  Per patient request  I/D: neg  Anticipated MOD:  NSVD  Roberta Nelson 10/20/2017, 8:51 AM

## 2017-10-21 ENCOUNTER — Other Ambulatory Visit: Payer: Self-pay

## 2017-10-21 NOTE — Lactation Note (Signed)
Lactation Consultation Note Mom's 2nd baby. Oldest is 30 yrs old. Mom didn't BF him, mom stated she didn't have any milk. Hand expressed mom w/colostrum to end of nipple. Encouraged to feel breast before and after BF for transfer. Mom has round breast w/everted nipples. Rt. Nipple has a small blister. Baby was BF when LC entered rm laying supine in cradle position w/ head turning towards breast. Discussed positioning, baby facing mom. Assisted in football position. Discussed body alignment, props, support, and comfort. Encouraged to massage breast before and after BF for transfer.  Newborn behavior, I&O, STS, cluster feeding, supply and demand discussed. Mom stated she would be breast and formula when she goes home because she doesn't have much milk. Discussed w/mom the reason may not had milk w/her son was d/t formula feeding as well.  Mom encouraged to feed baby 8-12 times/24 hours and with feeding cues.  WH/LC brochure given w/resources, support groups and LC services.  Patient Name: Roberta Nelson AVWUJ'WToday's Date: 10/21/2017 Reason for consult: Initial assessment   Maternal Data Has patient been taught Hand Expression?: Yes Does the patient have breastfeeding experience prior to this delivery?: No  Feeding Length of feed: 25 min(still BF)  LATCH Score Latch: Grasps breast easily, tongue down, lips flanged, rhythmical sucking.  Audible Swallowing: A few with stimulation  Type of Nipple: Everted at rest and after stimulation  Comfort (Breast/Nipple): Filling, red/small blisters or bruises, mild/mod discomfort  Hold (Positioning): Assistance needed to correctly position infant at breast and maintain latch.  LATCH Score: 7  Interventions Interventions: Breast feeding basics reviewed;Assisted with latch;Breast compression;Skin to skin;Adjust position;Breast massage;Support pillows;Hand express;Position options;Expressed milk  Lactation Tools Discussed/Used WIC Program:  No   Consult Status Consult Status: Follow-up Date: 10/22/17 Follow-up type: In-patient    Roberta Nelson, Diamond NickelLAURA G 10/21/2017, 5:05 AM

## 2017-10-21 NOTE — Plan of Care (Signed)
  Progressing Education: Knowledge of General Education information will improve 10/21/2017 1759 - Progressing by Princess PernaBurgess, Aliviya Schoeller D, RN Education: Knowledge of condition will improve 10/21/2017 1759 - Progressing by Princess PernaBurgess, Duward Allbritton D, RN Activity: Will verbalize the importance of balancing activity with adequate rest periods 10/21/2017 1759 - Progressing by Princess PernaBurgess, Yuleimy Kretz D, RN Life Cycle: Chance of risk for complications during the postpartum period will decrease 10/21/2017 1759 - Progressing by Princess PernaBurgess, Dawnelle Warman D, RN Role Relationship: Ability to demonstrate positive interaction with newborn will improve 10/21/2017 1759 - Progressing by Princess PernaBurgess, Nilan Iddings D, RN Note Pt has been engaged in care of newborn as evidenced by holding infant & talking to infant. Skin Integrity: Demonstration of wound healing without infection will improve 10/21/2017 1759 - Progressing by Princess PernaBurgess, Cuinn Westerhold D, RN

## 2017-10-21 NOTE — Progress Notes (Signed)
Post Partum Day 1 Subjective: no complaints, up ad lib, tolerating PO and + flatus  Objective: Blood pressure 96/61, pulse 67, temperature 98.6 F (37 C), temperature source Oral, resp. rate 18, height 4\' 9"  (1.448 m), weight 64.9 kg (143 lb), last menstrual period 01/09/2017, unknown if currently breastfeeding.  Physical Exam:  General: alert, cooperative and no distress Lochia: appropriate Uterine Fundus: firm DVT Evaluation: No evidence of DVT seen on physical exam. No cords or calf tenderness. No significant calf/ankle edema.  Recent Labs    10/20/17 0622  HGB 12.2  HCT 36.0    Assessment/Plan: Plan for discharge tomorrow, Breastfeeding and Contraception Nexplanon   LOS: 1 day   Oralia ManisSherin Kathalene Sporer, DO PGY-1 10/21/2017, 7:58 AM

## 2017-10-22 DIAGNOSIS — Z3A40 40 weeks gestation of pregnancy: Secondary | ICD-10-CM

## 2017-10-22 LAB — BIRTH TISSUE RECOVERY COLLECTION (PLACENTA DONATION)

## 2017-10-22 MED ORDER — IBUPROFEN 600 MG PO TABS: 600.0000 mg | ORAL_TABLET | Freq: Four times a day (QID) | ORAL | 0 refills | Status: DC

## 2017-10-22 MED ORDER — SENNOSIDES-DOCUSATE SODIUM 8.6-50 MG PO TABS: 2.0000 | ORAL_TABLET | ORAL | 0 refills | Status: DC

## 2017-10-22 NOTE — Discharge Instructions (Signed)

## 2017-10-22 NOTE — Discharge Summary (Signed)
OB Discharge Summary     Patient Name: Roberta Nelson DOB: 08-14-1986 MRN: [id number]  Date of admission: 10/20/2017 Delivering MD: Roberta Nelson   Date of discharge: 10/22/2017  Admitting diagnosis: 39 WEEKS CTX Intrauterine pregnancy: 3125w4d     Secondary diagnosis:  Active Problems:   Normal labor   SVD (spontaneous vaginal delivery)   Vaginal delivery  Additional problems: anemia      Discharge diagnosis: Term Pregnancy Delivered                                                                                                Post partum procedures:none  Augmentation: none  Complications: None  Hospital course:  Onset of Labor With Vaginal Delivery     32 y.o. yo [id number] at 8525w4d was admitted in Active Labor on 10/20/2017. Patient had an uncomplicated labor course as follows:  Membrane Rupture Time/Date: 11:50 AM ,10/20/2017   Intrapartum Procedures: Episiotomy: None [1]                                         Lacerations:  Perineal [11];1st degree [2]  Patient had a delivery of a Viable infant. 10/20/2017  Information for the patient's newborn:  Roberta Nelson, Girl Roberta Nelson [[id number]][[id number]]  Delivery Method: Vag-Spont    Pateint had an uncomplicated postpartum course.  She is ambulating, tolerating a regular diet, passing flatus, and urinating well. Patient is discharged home in stable condition on 10/22/17.   Physical exam  Vitals:   10/21/17 0500 10/21/17 1836 10/22/17 0500 10/22/17 0538  BP: 96/61 109/66  105/62  Pulse: 67 64  68  Resp: 18 18  18   Temp: 98.6 F (37 C) 98.4 F (36.9 C)  98.2 F (36.8 C)  TempSrc: Oral Oral  Oral  Weight: 64.9 kg (143 lb)  63.5 kg (140 lb 1.6 oz)   Height:       General: alert, cooperative and no distress Lochia: appropriate Uterine Fundus: firm Incision: N/A DVT Evaluation: No evidence of DVT seen on physical exam. No cords or calf tenderness. No significant calf/ankle edema. Labs: Lab Results  Component  Value Date   WBC 7.4 10/20/2017   HGB 12.2 10/20/2017   HCT 36.0 10/20/2017   MCV 85.9 10/20/2017   PLT 180 10/20/2017   CMP Latest Ref Rng & Units 01/23/2011  Glucose 70 - 99 mg/dL 92  BUN 6 - 23 mg/dL 15  Creatinine 1.300.40 - 8.651.20 mg/dL 7.840.61  Sodium 696135 - 295145 mEq/L 139  Potassium 3.5 - 5.3 mEq/L 4.1  Chloride 96 - 112 mEq/L 105  CO2 19 - 32 mEq/L 21  Calcium 8.4 - 10.5 mg/dL 8.9  Total Protein 6.0 - 8.3 g/dL 7.6  Total Bilirubin 0.3 - 1.2 mg/dL 2.8(U0.2(L)  Alkaline Phos 39 - 117 U/L 78  AST 0 - 37 U/L 19  ALT 0 - 35 U/L 16    Discharge instruction: per After Visit Summary and "Baby and Me Booklet".  After visit meds:  Allergies  as of 10/22/2017   No Known Allergies     Medication List    TAKE these medications   ibuprofen 600 MG tablet Commonly known as:  ADVIL,MOTRIN Take 1 tablet (600 mg total) by mouth every 6 (six) hours.   PNV PO Take by mouth.   senna-docusate 8.6-50 MG tablet Commonly known as:  Senokot-S Take 2 tablets by mouth daily. Start taking on:  10/23/2017       Diet: routine diet  Activity: Advance as tolerated. Pelvic rest for 6 weeks.   Outpatient follow up:4 weeks Follow up Appt:No future appointments. Follow up Visit:No Follow-up on file. Follow-up Information    Department, [facility]. Schedule an appointment as soon as possible for a visit in 4 week(s).   Why:  Please follow up in 4 weeks  Contact information: [address] [place] [address] [phone]           Postpartum contraception: Nexplanon  Newborn Data: Live born female  Birth Weight: 7 lb 4.9 oz (3315 g) APGAR: 9, 9  Newborn Delivery   Birth date/time:  10/20/2017 14:26:00 Delivery type:  Vaginal, Spontaneous     Baby Feeding: Breast Disposition:home with mother   10/22/2017, DO PGY-1 Roberta Manis, DO  I spoke with and examined patient and agree with resident/PA/SNM's note and plan of care.  Roberta Nelson, CNM 10/22/2017 7:36  AM

## 2018-08-09 IMAGING — US US MFM FETAL NUCHAL TRANSLUCENCY
1 series · 15 of 28 positions shown · non-contrast
Comparison: none

[Series 1: us mfm fetal nuchal translucency · 15 of 30 slices shown]
[im 1/30]
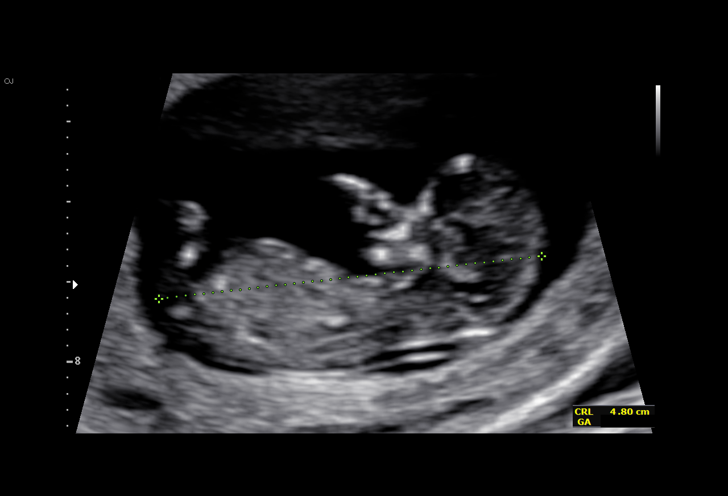
[im 3/30]
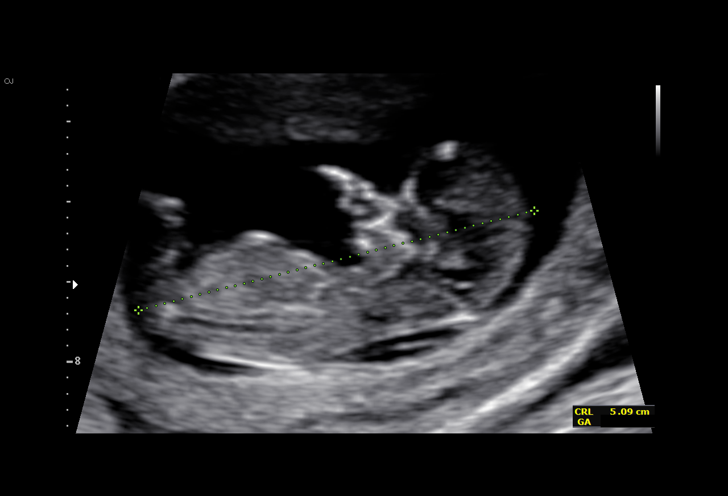
[im 5/30]
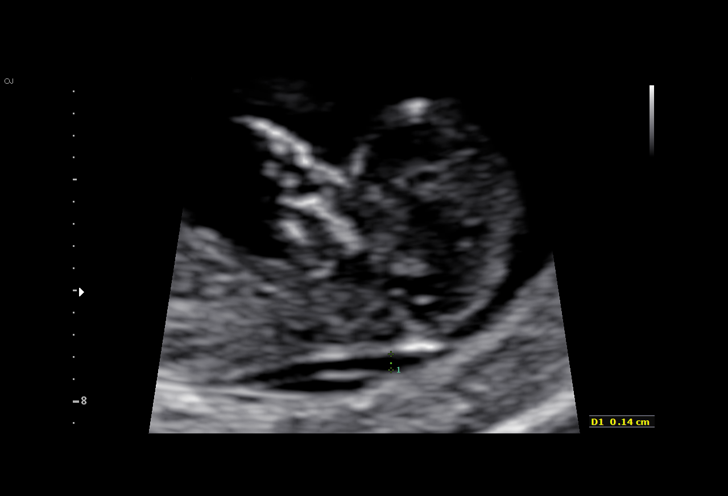
[im 7/30]
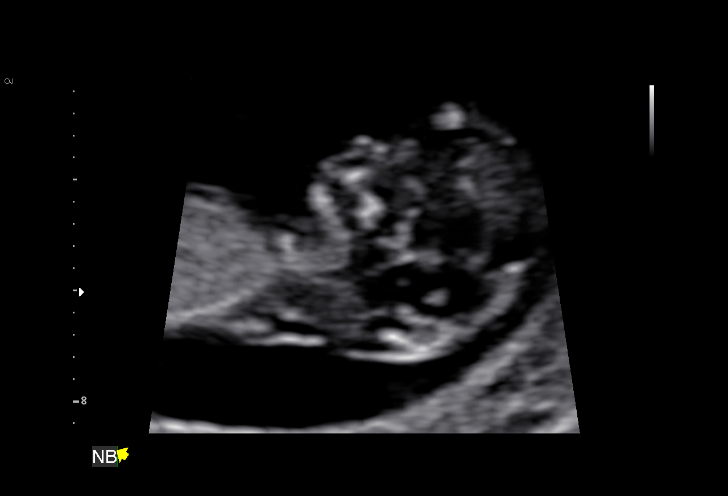
[im 9/30]
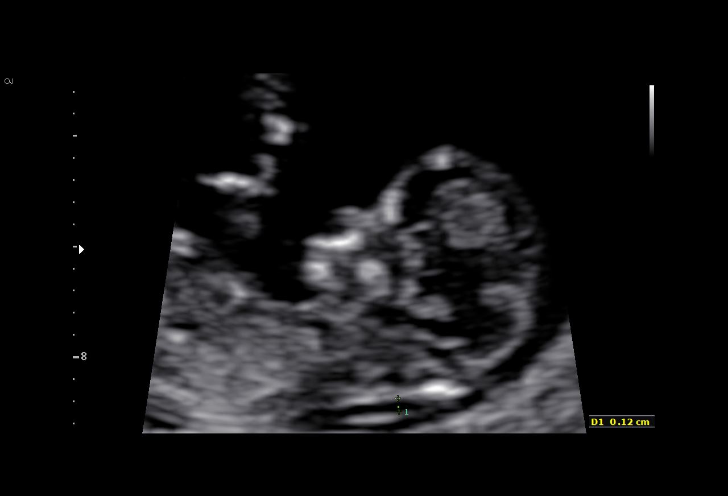
[im 11/30]
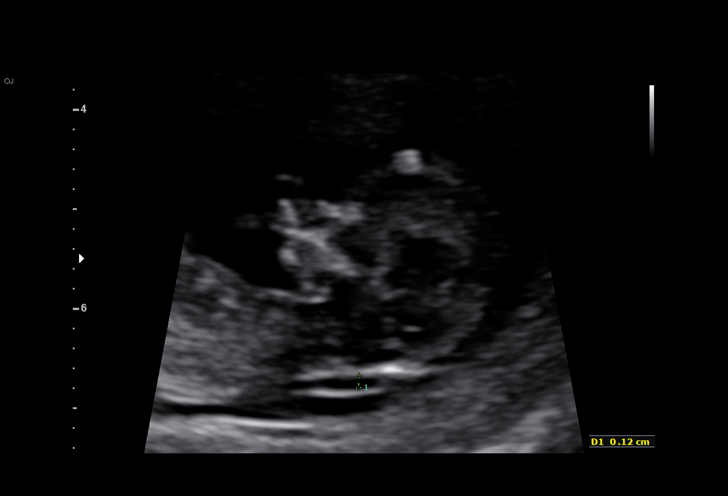
[im 13/30]
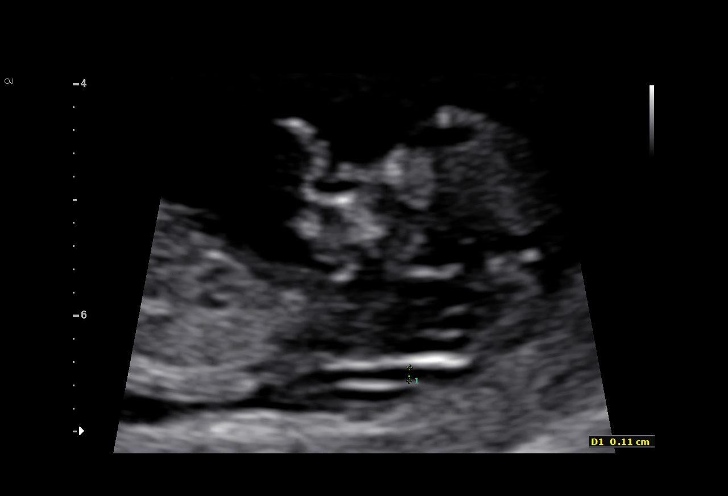
[im 16/30]
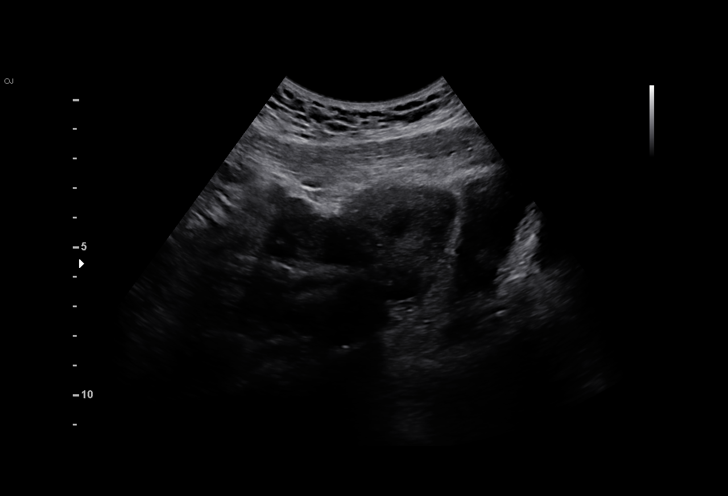
[im 17/30]
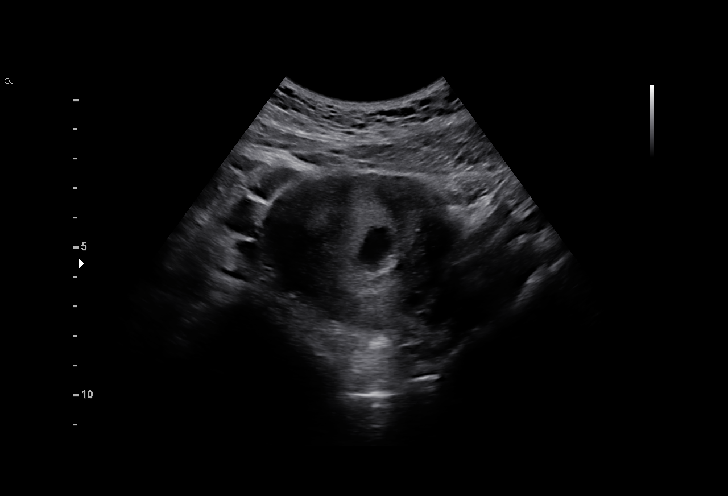
[im 19/30]
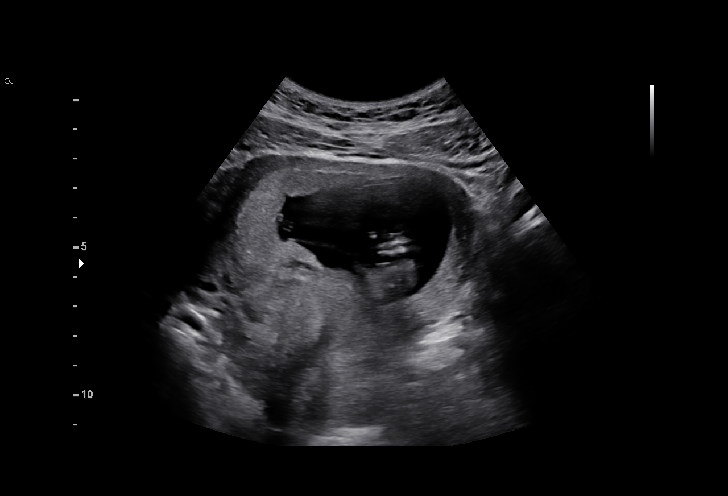
[im 21/30]
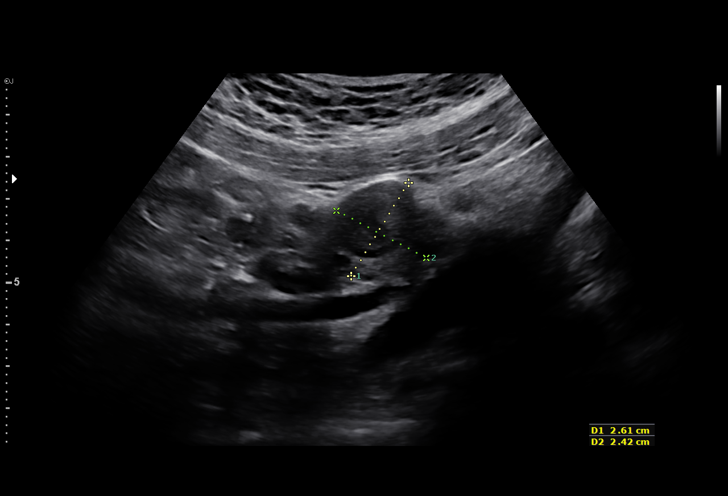
[im 23/30]
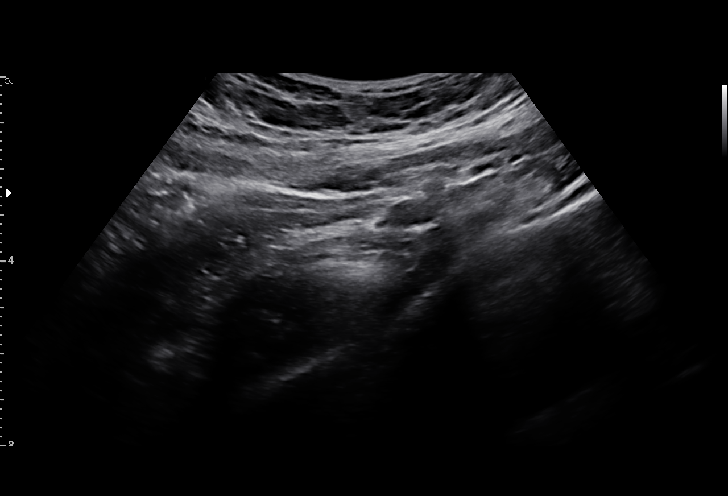
[im 25/30]
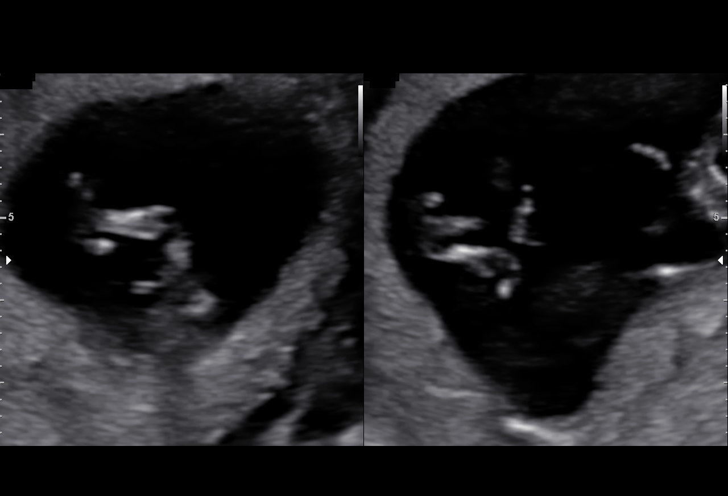
[im 27/30]
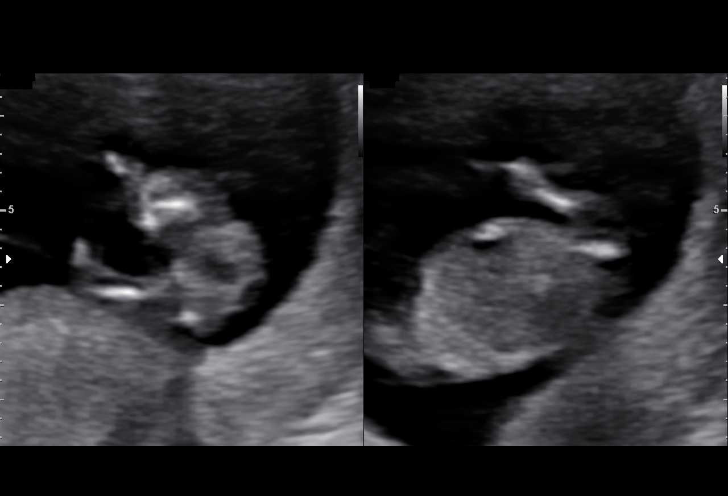
[im 30/30]
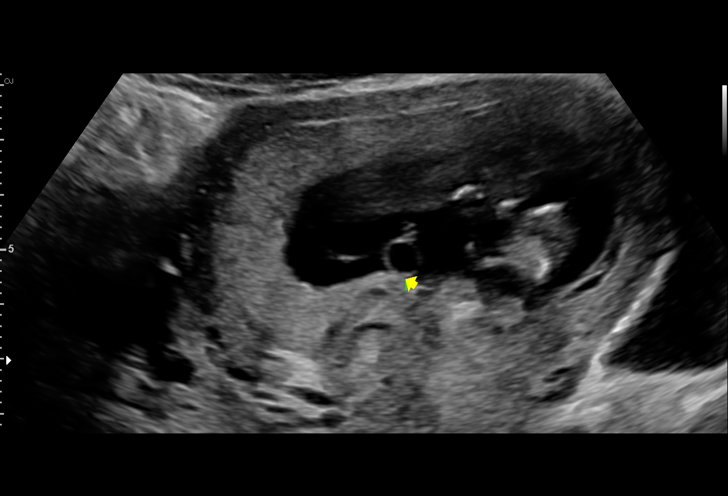

[15 of 28 positions shown; findings below may reference images not displayed]

[REDACTED] Health-
Faculty Physician

TRANSLUCENCY

1  PHANG             [id number]       [id number]     [id number]
Indications

11 weeks gestation of pregnancy
Encounter for nuchal translucency
OB History

Blood Type:            Height:  4'9"   Weight (lb):  127       BMI:
Gravidity:    2         Term:   1        Prem:   0        SAB:   0
TOP:          0       Ectopic:  0        Living: 1
Fetal Evaluation

Num Of Fetuses:     1
Preg. Location:     Intrauterine
Gest. Sac:          Intrauterine
Fetal Pole:         Visualized
Fetal Heart         171
Rate(bpm):
Cardiac Activity:   Observed
Gestational Age

LMP:           12w 6d        Date:  01/09/17                 EDD:   10/16/17
1st Trimester Genetic Sonogram Screening

CRL:            49.6  mm    G. Age:   11w 4d                 EDD:   10/25/17
Nuc Trans:       1.4  mm
Nasal Bone:                 Present
Anatomy

Abdomen:               Appears normal         Lower Extremities:      Visualized
Upper Extremities:     Visualized
Cervix Uterus Adnexa

Uterus
No abnormality visualized.

Left Ovary
Size(cm)       2.6  x   2.4    x  2.3       Vol(ml):
Within normal limits.

Right Ovary
Not visualized. No adnexal mass visualized.

Cul De Sac:   No free fluid seen.
Impression

SIUP at 55w3d
active singleton fetus
NT =1.4
nasal bone is present
fetal morphology is gestational age appropriate
Recommendations

1. 1st trimester screen labs sent
2. fetal survey in 6 weeks.

## 2019-09-15 DIAGNOSIS — G510 Bell's palsy: Secondary | ICD-10-CM

## 2019-09-15 HISTORY — DX: Bell's palsy: G51.0

## 2019-10-11 ENCOUNTER — Other Ambulatory Visit: Payer: Self-pay

## 2019-10-11 ENCOUNTER — Other Ambulatory Visit (HOSPITAL_COMMUNITY)
Admission: RE | Admit: 2019-10-11 | Discharge: 2019-10-11 | Disposition: A | Discharge status: Home / Self Care | Payer: Managed Care, Other (non HMO) | Source: Ambulatory Visit | Attending: Obstetrics & Gynecology | Admitting: Obstetrics & Gynecology

## 2019-10-11 ENCOUNTER — Encounter: Payer: Self-pay | Admitting: Certified Nurse Midwife

## 2019-10-11 ENCOUNTER — Ambulatory Visit: Payer: Managed Care, Other (non HMO) | Admitting: Certified Nurse Midwife

## 2019-10-11 VITALS — BP 120/80 | HR 68 | Temp 98.4°F | Resp 16 | Ht <= 58 in | Wt 139.0 lb

## 2019-10-11 DIAGNOSIS — Z8669 Personal history of other diseases of the nervous system and sense organs: Secondary | ICD-10-CM | POA: Diagnosis not present

## 2019-10-11 DIAGNOSIS — Z113 Encounter for screening for infections with a predominantly sexual mode of transmission: Secondary | ICD-10-CM | POA: Diagnosis not present

## 2019-10-11 DIAGNOSIS — Z01419 Encounter for gynecological examination (general) (routine) without abnormal findings: Secondary | ICD-10-CM

## 2019-10-11 DIAGNOSIS — Z124 Encounter for screening for malignant neoplasm of cervix: Secondary | ICD-10-CM | POA: Insufficient documentation

## 2019-10-11 NOTE — Patient Instructions (Signed)
EXERCISE AND DIET:  We recommended that you start or continue a regular exercise program for good health. Regular exercise means any activity that makes your heart beat faster and makes you sweat.  We recommend exercising at least 30 minutes per day at least 3 days a week, preferably 4 or 5.  We also recommend a diet low in fat and sugar.  Inactivity, poor dietary choices and obesity can cause diabetes, heart attack, stroke, and kidney damage, among others.    ALCOHOL AND SMOKING:  Women should limit their alcohol intake to no more than 7 drinks/beers/glasses of wine (combined, not each!) per week. Moderation of alcohol intake to this level decreases your risk of breast cancer and liver damage. And of course, no recreational drugs are part of a healthy lifestyle.  And absolutely no smoking or even second hand smoke. Most people know smoking can cause heart and lung diseases, but did you know it also contributes to weakening of your bones? Aging of your skin?  Yellowing of your teeth and nails?  CALCIUM AND VITAMIN D:  Adequate intake of calcium and Vitamin D are recommended.  The recommendations for exact amounts of these supplements seem to change often, but generally speaking 600 mg of calcium (either carbonate or citrate) and 800 units of Vitamin D per day seems prudent. Certain women may benefit from higher intake of Vitamin D.  If you are among these women, your doctor will have told you during your visit.    PAP SMEARS:  Pap smears, to check for cervical cancer or precancers,  have traditionally been done yearly, although recent scientific advances have shown that most women can have pap smears less often.  However, every woman still should have a physical exam from her gynecologist every year. It will include a breast check, inspection of the vulva and vagina to check for abnormal growths or skin changes, a visual exam of the cervix, and then an exam to evaluate the size and shape of the uterus and  ovaries.  And after 32 years of age, a rectal exam is indicated to check for rectal cancers. We will also provide age appropriate advice regarding health maintenance, like when you should have certain vaccines, screening for sexually transmitted diseases, bone density testing, colonoscopy, mammograms, etc.   MAMMOGRAMS:  All women over 40 years old should have a yearly mammogram. Many facilities now offer a "3D" mammogram, which may cost around $50 extra out of pocket. If possible,  we recommend you accept the option to have the 3D mammogram performed.  It both reduces the number of women who will be called back for extra views which then turn out to be normal, and it is better than the routine mammogram at detecting truly abnormal areas.    COLONOSCOPY:  Colonoscopy to screen for colon cancer is recommended for all women at age 50.  We know, you hate the idea of the prep.  We agree, BUT, having colon cancer and not knowing it is worse!!  Colon cancer so often starts as a polyp that can be seen and removed at colonscopy, which can quite literally save your life!  And if your first colonoscopy is normal and you have no family history of colon cancer, most women don't have to have it again for 10 years.  Once every ten years, you can do something that may end up saving your life, right?  We will be happy to help you get it scheduled when you are ready.    Be sure to check your insurance coverage so you understand how much it will cost.  It may be covered as a preventative service at no cost, but you should check your particular policy.      Bell Palsy, Adult  Bell palsy is a short-term inability to move muscles in part of the face. The inability to move (paralysis) results from inflammation or compression of the facial nerve, which travels along the skull and under the ear to the side of the face (7th cranial nerve). This nerve is responsible for facial movements that include blinking, closing the eyes,  smiling, and frowning. What are the causes? The exact cause of this condition is not known. It may be caused by an infection from a virus, such as the chickenpox (herpes zoster), Epstein-Barr, or mumps virus. What increases the risk? You are more likely to develop this condition if:  You are pregnant.  You have diabetes.  You have had a recent infection in your nose, throat, or airways (upper respiratory infection).  You have a weakened body defense system (immune system).  You have had a facial injury, such as a fracture.  You have a family history of Bell palsy. What are the signs or symptoms? Symptoms of this condition include:  Weakness on one side of the face.  Drooping eyelid and corner of the mouth.  Excessive tearing in one eye.  Difficulty closing the eyelid.  Dry eye.  Drooling.  Dry mouth.  Changes in taste.  Change in facial appearance.  Pain behind one ear.  Ringing in one or both ears.  Sensitivity to sound in one ear.  Facial twitching.  Headache.  Impaired speech.  Dizziness.  Difficulty eating or drinking. Most of the time, only one side of the face is affected. Rarely, Bell palsy affects the whole face. How is this diagnosed? This condition is diagnosed based on:  Your symptoms.  Your medical history.  A physical exam. You may also have to see health care providers who specialize in disorders of the nerves (neurologist) or diseases and conditions of the eye (ophthalmologist). You may have tests, such as:  A test to check for nerve damage (electromyogram).  Imaging studies, such as CT or MRI scans.  Blood tests. How is this treated? This condition affects every person differently. Sometimes symptoms go away without treatment within a couple weeks. If treatment is needed, it varies from person to person. The goal of treatment is to reduce inflammation and protect the eye from damage. Treatment for Bell palsy may  include:  Medicines, such as: ? Steroids to reduce swelling and inflammation. ? Antiviral drugs. ? Pain relievers, including aspirin, acetaminophen, or ibuprofen.  Eye drops or ointment to keep your eye moist.  Eye protection, if you cannot close your eye.  Exercises or massage to regain muscle strength and function (physical therapy). Follow these instructions at home:   Take over-the-counter and prescription medicines only as told by your health care provider.  If your eye is affected: ? Keep your eye moist with eye drops or ointment as told by your health care provider. ? Follow instructions for eye care and protection as told by your health care provider.  Do any physical therapy exercises as told by your health care provider.  Keep all follow-up visits as told by your health care provider. This is important. Contact a health care provider if:  You have a fever.  Your symptoms do not get better within 2-3 weeks, or your  symptoms get worse.  Your eye is red, irritated, or painful.  You have new symptoms. Get help right away if:  You have weakness or numbness in a part of your body other than your face.  You have trouble swallowing.  You develop neck pain or stiffness.  You develop dizziness or shortness of breath. Summary  Bell palsy is a short-term inability to move muscles in part of the face. The inability to move (paralysis) results from inflammation or compression of the facial nerve.  This condition affects every person differently. Sometimes symptoms go away without treatment within a couple weeks.  If treatment is needed, it varies from person to person. The goal of treatment is to reduce inflammation and protect the eye from damage.  Contact your health care provider if your symptoms do not get better within 2-3 weeks, or your symptoms get worse. This information is not intended to replace advice given to you by your health care provider. Make sure you  discuss any questions you have with your health care provider. Document Revised: 08/13/2017 Document Reviewed: 11/03/2016 Elsevier Patient Education  2020 [employer].

## 2019-10-11 NOTE — Progress Notes (Signed)
34 y.o. G80P2002 Married  Hispanic Fe here to establish gyn care and  for annual exam. Periods sporadic with Nexplanon. Inserted in 11/2017. Sees Urgent care if needed. Was seen recently at Urgent Care for Bell's Palsy flare again. Original onset at age 11 and again at 46, 89. Family history of endometrial cancer with mother (4).  Desires STD screening today.  No other health issues today.  Patient's last menstrual period was 09/20/2019 (exact date).          Sexually active: Yes.    The current method of family planning is nexplanon.    Exercising: No.  exercise Smoker:  no  Review of Systems  Constitutional: Negative.   HENT: Negative.   Eyes: Negative.   Respiratory: Negative.   Cardiovascular: Negative.   Gastrointestinal: Negative.   Genitourinary: Negative.   Musculoskeletal: Negative.   Skin: Negative.   Neurological: Negative.   Endo/Heme/Allergies: Negative.   Psychiatric/Behavioral: Negative.     Health Maintenance: Pap:  A couple years ago History of Abnormal Pap: no MMG:  none Self Breast exams: yes Colonoscopy:  none BMD:   none TDaP:  2019 Shingles: no Pneumonia: no Hep C and HIV: neg per patient during pregnancy Labs: if needed   reports that she has never smoked. She has never used smokeless tobacco. She reports that she does not drink alcohol or use drugs.  Past Medical History:  Diagnosis Date  . Bell's palsy   . Medical history non-contributory     Past Surgical History:  Procedure Laterality Date  . nexplanon     inserted 2019 per patient  . NO PAST SURGERIES      Current Outpatient Medications  Medication Sig Dispense Refill  . fluticasone (FLONASE) 50 MCG/ACT nasal spray Place into the nose.    . ibuprofen (ADVIL,MOTRIN) 600 MG tablet Take 1 tablet (600 mg total) by mouth every 6 (six) hours. 30 tablet 0  . Prenatal Vit w/Fe-Methylfol-FA (PNV PO) Take by mouth.     No current facility-administered medications for this visit.    Family  History  Problem Relation Age of Onset  . Cancer Mother        endometrial  . Diabetes Maternal Grandmother     ROS:  Pertinent items are noted in HPI.  Otherwise, a comprehensive ROS was negative.  Exam:   BP 120/80   Pulse 68   Temp 98.4 F (36.9 C) (Skin)   Resp 16   Ht 4' 9.25" (1.454 m)   Wt 139 lb (63 kg)   LMP 09/20/2019 (Exact Date)   BMI 29.82 kg/m  Height: 4' 9.25" (145.4 cm) Ht Readings from Last 3 Encounters:  10/11/19 4' 9.25" (1.454 m)  10/20/17 4\' 9"  (1.448 m)  04/09/17 4' 9.5" (1.461 m)    General appearance: alert, cooperative and appears stated age Head: Normocephalic, without obvious abnormality, atraumatic Neck: no adenopathy, supple, symmetrical, trachea midline and thyroid normal to inspection and palpation Lungs: clear to auscultation bilaterally Breasts: normal appearance, no masses or tenderness, No nipple retraction or dimpling, No nipple discharge or bleeding, No axillary or supraclavicular adenopathy, Taught monthly breast self examination Heart: regular rate and rhythm Abdomen: soft, non-tender; no masses,  no organomegaly Extremities: extremities normal, atraumatic, no cyanosis or edema, left arm Nexplanon palpated intact in appropriate position Skin: Skin color, texture, turgor normal. No rashes or lesions Lymph nodes: Cervical, supraclavicular, and axillary nodes normal. No abnormal inguinal nodes palpated Neurologic: Grossly normal   Pelvic: External genitalia:  no lesions, normal female              Urethra:  normal appearing urethra with no masses, tenderness or lesions              Bartholin's and Skene's: normal                 Vagina: normal appearing vagina with normal color and discharge, no lesions              Cervix: multiparous appearance, no cervical motion tenderness and no lesions              Pap taken: Yes.   Bimanual Exam:  Uterus:  normal size, contour, position, consistency, mobility, non-tender and anteverted               Adnexa: normal adnexa and no mass, fullness, tenderness               Rectovaginal: Confirms               Anus:  normal sphincter tone, no lesions  Chaperone present: yes  A:  Well Woman with normal exam  Contraception Nexplanon  History of Bell's Palsy needs follow up  STD screening  P:   Reviewed health and wellness pertinent to exam  Discussed warning signs with Nexplanon and expectations with bleeding profile. Patient would like tubal ligation when it needs to be replaced instead.  Discussed with patient should be seeing Neurology for follow up due to recent occurrence. Will place referral and patient will be called with information.  Labs: Affirm, Gc/chlamydia  Pap smear: yes   counseled on breast self exam, STD prevention, HIV risk factors and prevention, feminine hygiene, adequate intake of calcium and vitamin D, diet and exercise  return annually or prn  An After Visit Summary was printed and given to the patient.

## 2019-10-12 LAB — CYTOLOGY - PAP
Chlamydia: NEGATIVE
Comment: NEGATIVE
Comment: NEGATIVE
Comment: NORMAL
Diagnosis: NEGATIVE
High risk HPV: NEGATIVE
Neisseria Gonorrhea: NEGATIVE

## 2019-10-12 LAB — VAGINITIS/VAGINOSIS, DNA PROBE
Candida Species: NEGATIVE
Gardnerella vaginalis: NEGATIVE
Trichomonas vaginosis: NEGATIVE

## 2019-10-18 ENCOUNTER — Telehealth: Payer: Self-pay | Admitting: Certified Nurse Midwife

## 2019-10-18 DIAGNOSIS — Z8669 Personal history of other diseases of the nervous system and sense organs: Secondary | ICD-10-CM

## 2019-10-18 NOTE — Telephone Encounter (Signed)
Patient checking on referral to neurology.

## 2019-10-18 NOTE — Telephone Encounter (Signed)
Spoke to pt. Pt given information on referral to [facility] to see Dr Lucia Gaskins. Pt agreeable. Referral placed. Ok per Ortencia Kick, CNM.   Routing to D. Darcel Bayley, CNM for review and will close encounter.  Cc: Rosa to see referral and send records.

## 2019-11-14 ENCOUNTER — Ambulatory Visit: Payer: Managed Care, Other (non HMO) | Admitting: Neurology

## 2019-11-14 ENCOUNTER — Other Ambulatory Visit: Payer: Self-pay

## 2019-11-14 ENCOUNTER — Encounter: Payer: Self-pay | Admitting: Neurology

## 2019-11-14 ENCOUNTER — Telehealth: Payer: Self-pay | Admitting: Neurology

## 2019-11-14 VITALS — BP 130/69 | HR 72 | Temp 98.0°F | Ht <= 58 in | Wt 140.0 lb

## 2019-11-14 DIAGNOSIS — G510 Bell's palsy: Secondary | ICD-10-CM

## 2019-11-14 DIAGNOSIS — G518 Other disorders of facial nerve: Secondary | ICD-10-CM | POA: Diagnosis not present

## 2019-11-14 NOTE — Telephone Encounter (Signed)
[employer] order sent to GI. They will obtain the auth and reach out to the patient to schedule.  

## 2019-11-14 NOTE — Progress Notes (Signed)
[facility]    Provider:  Dr Lucia Gaskins Requesting Provider: Verner Chol, CNM Primary Care Provider:  Verner Chol, CNM  CC:  [facility]er Palsy  HPI:  Roberta Nelson is a 34 y.o. female here as requested by Verner Chol, CNM for Bell's palsy. First she was 17-16, then 31, and most recently 6 weeks ago. She went to urgent care and she was treated. Always on the left. She feels it is getting better. Its improving but still having difficulty closing eye and smiling. The last 2 episodes she was completely resolved. No sensory changes, no hearing problems, no dryness of the eyes but the eye is watering, she is wearing glasses, no problems with vision, no recent infections or illnesses, no ear pain or hearing changes, no rashes. She does not have a primary care she go to women's clinic. 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 No difficulty eating or drinking.  No other focal neurologic deficits, associated symptoms, inciting events or modifiable factors.  Reviewed notes, labs and imaging from outside physicians, which showed:  RPR NR  Review of Systems: Patient complains of symptoms per HPI as well as the following symptoms: bells palsy. Pertinent negatives and positives per HPI. All others negative.   Social History   Socioeconomic History  . Marital status: Married    Spouse name: Not on file  . Number of children: 2  . Years of education: Not on file  . Highest education level: 11th grade  Occupational History  . Not on file  Tobacco Use  . Smoking status: Never Smoker  . Smokeless tobacco: Never Used  . Tobacco comment: 1-2 times a year smokes one cigarette  Substance and Sexual Activity  . Alcohol use: No  . Drug use: No  . Sexual activity: Not Currently    Birth control/protection: Implant  Other Topics Concern  . Not on file  Social History Narrative   Lives at home with husband and kids in an apartment   Right  handed   Caffeine: coffee, maybe 2 cups/day   Social Determinants of Health   Financial Resource Strain:   . Difficulty of Paying Living Expenses: Not on file  Food Insecurity:   . Worried About Programme researcher, broadcasting/film/video in the Last Year: Not on file  . Ran Out of Food in the Last Year: Not on file  Transportation Needs:   . Lack of Transportation (Medical): Not on file  . Lack of Transportation (Non-Medical): Not on file  Physical Activity:   . Days of Exercise per Week: Not on file  . Minutes of Exercise per Session: Not on file  Stress:   . Feeling of Stress : Not on file  Social Connections:   . Frequency of Communication with Friends and Family: Not on file  . Frequency of Social Gatherings with Friends and Family: Not on file  . Attends Religious Services: Not on file  . Active Member of Clubs or Organizations: Not on file  . Attends Banker Meetings: Not on file  . Marital Status: Not on file  Intimate Partner Violence:   . Fear of Current or Ex-Partner: Not on file  . Emotionally Abused: Not on file  . Physically Abused: Not on file  . Sexually Abused: Not on file    Family History  Problem Relation Age of Onset  . Cancer Mother        endometrial  . Diabetes Maternal  Grandmother   . Bell's palsy Neg Hx     Past Medical History:  Diagnosis Date  . Bell's palsy   . Medical history non-contributory     Patient Active Problem List   Diagnosis Date Noted  . Normal labor 10/20/2017  . SVD (spontaneous vaginal delivery) 10/20/2017  . Vaginal delivery 10/20/2017  . Transaminitis 01/23/2011  . Epigastric pain 01/23/2011    Past Surgical History:  Procedure Laterality Date  . nexplanon     inserted 2019 per patient  . NO PAST SURGERIES      Current Outpatient Medications  Medication Sig Dispense Refill  . fluticasone (FLONASE) 50 MCG/ACT nasal spray Place into the nose.    . ibuprofen (ADVIL,MOTRIN) 600 MG tablet Take 1 tablet (600 mg total) by  mouth every 6 (six) hours. 30 tablet 0  . Prenatal Vit w/Fe-Methylfol-FA (PNV PO) Take by mouth.     No current facility-administered medications for this visit.    Allergies as of 11/14/2019  . (No Known Allergies)    Vitals: BP 130/69 (BP Location: Left Arm, Patient Position: Sitting)   Pulse 72   Temp 98 F (36.7 C) Comment: taken at front  Ht 4\' 9"  (1.448 m)   Wt 140 lb (63.5 kg)   Breastfeeding No   BMI 30.30 kg/m  Last Weight:  Wt Readings from Last 1 Encounters:  11/14/19 140 lb (63.5 kg)   Last Height:   Ht Readings from Last 1 Encounters:  11/14/19 4\' 9"  (1.448 m)     Physical exam: Exam: Gen: NAD, conversant, well nourised, well groomed, overweight        CV: RRR, no MRG. No Carotid Bruits. No peripheral edema, warm, nontender Eyes: Conjunctivae clear without exudates or hemorrhage Nck: supple  Skin: no rashes Tongue: no fissures  Neuro: Detailed Neurologic Exam  Speech:    Speech is normal; fluent and spontaneous with normal comprehension.  Cognition:    The patient is oriented to person, place, and time;     recent and remote memory intact;     language fluent;     normal attention, concentration,     fund of knowledge Cranial Nerves:    The pupils are equal, round, and reactive to light. Attempted fundoscopy could not visualize. Visual fields are full to finger confrontation. Extraocular movements are intact. Trigeminal sensation is intact and the muscles of mastication are normal. Decreased closure of left eye and weakness of eyebrow raise, left lower facial weakness. The palate elevates in the midline. Hearing intact. Voice is normal. Shoulder shrug is normal. The tongue has normal motion without fasciculations.   Coordination:    No dysmetria  Gait:    normal native gait  Motor Observation:    No asymmetry, no atrophy, and no involuntary movements noted. Tone:    Normal muscle tone.    Posture:    Posture is normal. normal erect      Strength:    Strength is V/V in the upper and lower limbs.      Sensation: intact to LT     Reflex Exam:  DTR's:    Deep tendon reflexes in the upper and lower extremities are hyporeflexic but symmetrical bilaterally.   Toes:    The toes are downgoing bilaterally.   Clonus:    Clonus is absent.    Assessment/Plan:  Patient with recurrent bells palsy.  Need MRI of the brain to evaluate CN7 and the brain parenchyma for other causes, rule out any  malignancies, lesions  Orders Placed This Encounter  Procedures  . MR BRAIN W WO CONTRAST  . HSV(herpes simplex vrs) 1+2 ab-IgG  . HIV Antibody (routine testing w rflx)  . Sjogren's syndrome antibods(ssa + ssb)  . B. burgdorfi Antibody  . Comprehensive metabolic panel  . CBC  . TSH    Cc: Verner Chol, CNM  Naomie Dean, MD  [facility] [address] [place], [place] [address]  Phone [phone] Fax [phone]  A total of 45 minutes was spent on this patient's care, reviewing imaging, past records, recent hospitalization notes and results. 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 Cranial nerve VI palsy, left   3. Bell's palsy    diagnosis and different diagnostic and therapeutic options, counseling and coordination of care, risks and benefitsof management, compliance, or risk factor reduction and education.

## 2019-11-14 NOTE — Patient Instructions (Signed)
Blood work MRI of the brain   Bell Palsy, Adult  Bell palsy is a short-term inability to move muscles in part of the face. The inability to move (paralysis) results from inflammation or compression of the facial nerve, which travels along the skull and under the ear to the side of the face (7th cranial nerve). This nerve is responsible for facial movements that include blinking, closing the eyes, smiling, and frowning. What are the causes? The exact cause of this condition is not known. It may be caused by an infection from a virus, such as the chickenpox (herpes zoster), Epstein-Barr, or mumps virus. What increases the risk? You are more likely to develop this condition if:  You are pregnant.  You have diabetes.  You have had a recent infection in your nose, throat, or airways (upper respiratory infection).  You have a weakened body defense system (immune system).  You have had a facial injury, such as a fracture.  You have a family history of Bell palsy. What are the signs or symptoms? Symptoms of this condition include:  Weakness on one side of the face.  Drooping eyelid and corner of the mouth.  Excessive tearing in one eye.  Difficulty closing the eyelid.  Dry eye.  Drooling.  Dry mouth.  Changes in taste.  Change in facial appearance.  Pain behind one ear.  Ringing in one or both ears.  Sensitivity to sound in one ear.  Facial twitching.  Headache.  Impaired speech.  Dizziness.  Difficulty eating or drinking. Most of the time, only one side of the face is affected. Rarely, Bell palsy affects the whole face. How is this diagnosed? This condition is diagnosed based on:  Your symptoms.  Your medical history.  A physical exam. You may also have to see health care providers who specialize in disorders of the nerves (neurologist) or diseases and conditions of the eye (ophthalmologist). You may have tests, such as:  A test to check for nerve damage  (electromyogram).  Imaging studies, such as CT or MRI scans.  Blood tests. How is this treated? This condition affects every person differently. Sometimes symptoms go away without treatment within a couple weeks. If treatment is needed, it varies from person to person. The goal of treatment is to reduce inflammation and protect the eye from damage. Treatment for Bell palsy may include:  Medicines, such as: ? Steroids to reduce swelling and inflammation. ? Antiviral drugs. ? Pain relievers, including aspirin, acetaminophen, or ibuprofen.  Eye drops or ointment to keep your eye moist.  Eye protection, if you cannot close your eye.  Exercises or massage to regain muscle strength and function (physical therapy). Follow these instructions at home:   Take over-the-counter and prescription medicines only as told by your health care provider.  If your eye is affected: ? Keep your eye moist with eye drops or ointment as told by your health care provider. ? Follow instructions for eye care and protection as told by your health care provider.  Do any physical therapy exercises as told by your health care provider.  Keep all follow-up visits as told by your health care provider. This is important. Contact a health care provider if:  You have a fever.  Your symptoms do not get better within 2-3 weeks, or your symptoms get worse.  Your eye is red, irritated, or painful.  You have new symptoms. Get help right away if:  You have weakness or numbness in a part of your  body other than your face.  You have trouble swallowing.  You develop neck pain or stiffness.  You develop dizziness or shortness of breath. Summary  Bell palsy is a short-term inability to move muscles in part of the face. The inability to move (paralysis) results from inflammation or compression of the facial nerve.  This condition affects every person differently. Sometimes symptoms go away without treatment within  a couple weeks.  If treatment is needed, it varies from person to person. The goal of treatment is to reduce inflammation and protect the eye from damage.  Contact your health care provider if your symptoms do not get better within 2-3 weeks, or your symptoms get worse. This information is not intended to replace advice given to you by your health care provider. Make sure you discuss any questions you have with your health care provider. Document Revised: 08/13/2017 Document Reviewed: 11/03/2016 Elsevier Patient Education  2020 [employer].

## 2019-11-15 ENCOUNTER — Other Ambulatory Visit: Payer: Self-pay | Admitting: Neurology

## 2019-11-15 DIAGNOSIS — G510 Bell's palsy: Secondary | ICD-10-CM

## 2019-11-15 LAB — COMPREHENSIVE METABOLIC PANEL
ALT: 15 IU/L (ref 0–32)
AST: 14 IU/L (ref 0–40)
Albumin/Globulin Ratio: 1.5 (ref 1.2–2.2)
Albumin: 4.2 g/dL (ref 3.8–4.8)
Alkaline Phosphatase: 64 IU/L (ref 39–117)
BUN/Creatinine Ratio: 18 (ref 9–23)
BUN: 9 mg/dL (ref 6–20)
Bilirubin Total: <0.2 mg/dL (ref 0.0–1.2)
CO2: 22 mmol/L (ref 20–29)
Calcium: 8.9 mg/dL (ref 8.7–10.2)
Chloride: 106 mmol/L (ref 96–106)
Creatinine, Ser: 0.5 mg/dL — ABNORMAL LOW (ref 0.57–1.00)
GFR calc Af Amer: 149 mL/min/{1.73_m2} (ref >59)
GFR calc non Af Amer: 129 mL/min/{1.73_m2} (ref >59)
Globulin, Total: 2.8 g/dL (ref 1.5–4.5)
Glucose: 92 mg/dL (ref 65–99)
Potassium: 4 mmol/L (ref 3.5–5.2)
Sodium: 140 mmol/L (ref 134–144)
Total Protein: 7 g/dL (ref 6.0–8.5)

## 2019-11-15 LAB — CBC
Hematocrit: 39.9 % (ref 34.0–46.6)
Hemoglobin: 13.3 g/dL (ref 11.1–15.9)
MCH: 29.2 pg (ref 26.6–33.0)
MCHC: 33.3 g/dL (ref 31.5–35.7)
MCV: 88 fL (ref 79–97)
Platelets: 162 10*3/uL (ref 150–450)
RBC: 4.56 x10E6/uL (ref 3.77–5.28)
RDW: 13.4 % (ref 11.7–15.4)
WBC: 5.8 10*3/uL (ref 3.4–10.8)

## 2019-11-15 LAB — HSV(HERPES SIMPLEX VRS) I + II AB-IGG
HSV 1 Glycoprotein G Ab, IgG: 53.1 index — ABNORMAL HIGH (ref 0.00–0.90)
HSV 2 IgG, Type Spec: <0.91 index (ref 0.00–0.90)

## 2019-11-15 LAB — SJOGREN'S SYNDROME ANTIBODS(SSA + SSB)
ENA SSA (RO) Ab: <0.2 AI (ref 0.0–0.9)
ENA SSB (LA) Ab: <0.2 AI (ref 0.0–0.9)

## 2019-11-15 LAB — B. BURGDORFI ANTIBODIES: Lyme IgG/IgM Ab: <0.91 {ISR} (ref 0.00–0.90)

## 2019-11-15 LAB — HIV ANTIBODY (ROUTINE TESTING W REFLEX): HIV Screen 4th Generation wRfx: NONREACTIVE

## 2019-11-15 LAB — TSH: TSH: 1.88 u[IU]/mL (ref 0.450–4.500)

## 2019-12-01 ENCOUNTER — Encounter: Payer: Self-pay | Admitting: Certified Nurse Midwife

## 2019-12-07 NOTE — Telephone Encounter (Signed)
Rutherford Nail: [id number] (Exp. 12/06/19 to 06/03/20) patient is scheduled at GI for 12/12/19.

## 2019-12-12 ENCOUNTER — Ambulatory Visit
Admission: RE | Admit: 2019-12-12 | Discharge: 2019-12-12 | Disposition: A | Discharge status: Home / Self Care | Payer: Managed Care, Other (non HMO) | Source: Ambulatory Visit | Attending: Neurology | Admitting: Neurology

## 2019-12-12 ENCOUNTER — Other Ambulatory Visit: Payer: Self-pay

## 2019-12-12 DIAGNOSIS — G518 Other disorders of facial nerve: Secondary | ICD-10-CM

## 2019-12-12 DIAGNOSIS — G510 Bell's palsy: Secondary | ICD-10-CM

## 2019-12-12 MED ORDER — GADOBENATE DIMEGLUMINE 529 MG/ML IV SOLN
12.0000 mL | Freq: Once | INTRAVENOUS | Status: AC | PRN
  Administered 2019-12-12: 13 mL via INTRAVENOUS

## 2019-12-18 ENCOUNTER — Other Ambulatory Visit: Payer: Self-pay | Admitting: Neurology

## 2019-12-18 MED ORDER — VALACYCLOVIR HCL 500 MG PO TABS: 500.0000 mg | ORAL_TABLET | Freq: Every day | ORAL | 11 refills | Status: AC

## 2020-10-11 ENCOUNTER — Ambulatory Visit: Payer: Managed Care, Other (non HMO) | Admitting: Certified Nurse Midwife

## 2021-01-02 ENCOUNTER — Other Ambulatory Visit: Payer: Self-pay

## 2021-01-02 ENCOUNTER — Encounter (HOSPITAL_BASED_OUTPATIENT_CLINIC_OR_DEPARTMENT_OTHER): Payer: Self-pay | Admitting: Obstetrics and Gynecology

## 2021-01-02 NOTE — Progress Notes (Signed)
Spoke w/ via phone for pre-op interview---pt Lab needs dos----   t & s , urine preg            Lab results------none COVID test ------01-03-2021 1215 Arrive at -------1030 am 01-07-2021 NPO after MN NO Solid Food.   water from MN until---930 am then npo Med rec completed Medications to take morning of surgery -----noneDiabetic medication ----- Patient instructed to bring photo id and insurance card day of surgery Patient aware to have Driver (ride ) / caregiver  Spouse david Falco will stay   for 24 hours after surgery  Patient Special Instructions -----none Pre-Op special Istructions -----none Patient verbalized understanding of instructions that were given at this phone interview. Patient denies shortness of breath, chest pain, fever, cough at this phone interview.  Patient speaks english well and states no interpreter needed for day of surgery  Called dr Timothy Lasso office and left message patient booked for over night stay, orders say same day surgery

## 2021-01-03 ENCOUNTER — Other Ambulatory Visit (HOSPITAL_COMMUNITY)
Admission: RE | Admit: 2021-01-03 | Discharge: 2021-01-03 | Disposition: A | Discharge status: Home / Self Care | Payer: BC Managed Care – PPO | Source: Ambulatory Visit | Attending: Obstetrics and Gynecology | Admitting: Obstetrics and Gynecology

## 2021-01-03 DIAGNOSIS — Z20822 Contact with and (suspected) exposure to covid-19: Secondary | ICD-10-CM | POA: Insufficient documentation

## 2021-01-03 DIAGNOSIS — Z01812 Encounter for preprocedural laboratory examination: Secondary | ICD-10-CM | POA: Diagnosis not present

## 2021-01-04 LAB — SARS CORONAVIRUS 2 (TAT 6-24 HRS): SARS Coronavirus 2: NEGATIVE

## 2021-01-06 NOTE — Progress Notes (Signed)
Attempted to call patient to confirm updated arrival time of 1000 on 01/07/21. Left voicemail notifying patient and requested she return call to confirm she received message.

## 2021-01-07 ENCOUNTER — Ambulatory Visit (HOSPITAL_BASED_OUTPATIENT_CLINIC_OR_DEPARTMENT_OTHER): Payer: BC Managed Care – PPO | Admitting: Anesthesiology

## 2021-01-07 ENCOUNTER — Encounter (HOSPITAL_BASED_OUTPATIENT_CLINIC_OR_DEPARTMENT_OTHER): Payer: Self-pay | Admitting: Obstetrics and Gynecology

## 2021-01-07 ENCOUNTER — Other Ambulatory Visit: Payer: Self-pay

## 2021-01-07 ENCOUNTER — Encounter (HOSPITAL_BASED_OUTPATIENT_CLINIC_OR_DEPARTMENT_OTHER)
Admission: RE | Disposition: A | Discharge status: Home / Self Care | Payer: Self-pay | Source: Home / Self Care | Attending: Obstetrics and Gynecology

## 2021-01-07 ENCOUNTER — Ambulatory Visit (HOSPITAL_BASED_OUTPATIENT_CLINIC_OR_DEPARTMENT_OTHER)
Admission: RE | Admit: 2021-01-07 | Discharge: 2021-01-07 | Disposition: A | Discharge status: Home / Self Care | Payer: BC Managed Care – PPO | Attending: Obstetrics and Gynecology | Admitting: Obstetrics and Gynecology

## 2021-01-07 DIAGNOSIS — Z3049 Encounter for surveillance of other contraceptives: Secondary | ICD-10-CM | POA: Diagnosis not present

## 2021-01-07 DIAGNOSIS — Z302 Encounter for sterilization: Secondary | ICD-10-CM | POA: Insufficient documentation

## 2021-01-07 HISTORY — PX: LAPAROSCOPIC BILATERAL SALPINGECTOMY: SHX5889

## 2021-01-07 HISTORY — DX: Headache, unspecified: R51.9

## 2021-01-07 LAB — TYPE AND SCREEN
ABO/RH(D): O POS
Antibody Screen: NEGATIVE

## 2021-01-07 LAB — POCT PREGNANCY, URINE: Preg Test, Ur: NEGATIVE

## 2021-01-07 SURGERY — SALPINGECTOMY, BILATERAL, LAPAROSCOPIC
Anesthesia: General | Site: Abdomen | Laterality: Bilateral

## 2021-01-07 MED ORDER — DEXAMETHASONE SODIUM PHOSPHATE 10 MG/ML IJ SOLN
INTRAMUSCULAR | Status: DC | PRN
  Administered 2021-01-07: 8 mg via INTRAVENOUS

## 2021-01-07 MED ORDER — FENTANYL CITRATE (PF) 100 MCG/2ML IJ SOLN
INTRAMUSCULAR | Status: AC
  Filled 2021-01-07: qty 2

## 2021-01-07 MED ORDER — ROCURONIUM BROMIDE 10 MG/ML (PF) SYRINGE
PREFILLED_SYRINGE | INTRAVENOUS | Status: DC | PRN
  Administered 2021-01-07: 80 mg via INTRAVENOUS

## 2021-01-07 MED ORDER — SCOPOLAMINE 1 MG/3DAYS TD PT72
MEDICATED_PATCH | TRANSDERMAL | Status: AC
  Filled 2021-01-07: qty 1

## 2021-01-07 MED ORDER — LIDOCAINE 2% (20 MG/ML) 5 ML SYRINGE
INTRAMUSCULAR | Status: DC | PRN
  Administered 2021-01-07: 50 mg via INTRAVENOUS

## 2021-01-07 MED ORDER — LACTATED RINGERS IV SOLN: INTRAVENOUS | Status: DC

## 2021-01-07 MED ORDER — PROMETHAZINE HCL 25 MG/ML IJ SOLN: 6.2500 mg | INTRAMUSCULAR | Status: DC | PRN

## 2021-01-07 MED ORDER — KETOROLAC TROMETHAMINE 30 MG/ML IJ SOLN
INTRAMUSCULAR | Status: AC
  Filled 2021-01-07: qty 1

## 2021-01-07 MED ORDER — ONDANSETRON HCL 4 MG/2ML IJ SOLN
INTRAMUSCULAR | Status: AC
  Filled 2021-01-07: qty 2

## 2021-01-07 MED ORDER — HYDROMORPHONE HCL 1 MG/ML IJ SOLN
0.2500 mg | INTRAMUSCULAR | Status: DC | PRN
  Administered 2021-01-07: 0.25 mg via INTRAVENOUS

## 2021-01-07 MED ORDER — PROPOFOL 10 MG/ML IV BOLUS
INTRAVENOUS | Status: DC | PRN
  Administered 2021-01-07: 150 mg via INTRAVENOUS

## 2021-01-07 MED ORDER — PROPOFOL 10 MG/ML IV BOLUS
INTRAVENOUS | Status: AC
  Filled 2021-01-07: qty 20

## 2021-01-07 MED ORDER — FENTANYL CITRATE (PF) 100 MCG/2ML IJ SOLN
INTRAMUSCULAR | Status: DC | PRN
  Administered 2021-01-07: 100 ug via INTRAVENOUS

## 2021-01-07 MED ORDER — ACETAMINOPHEN 500 MG PO TABS
1000.0000 mg | ORAL_TABLET | Freq: Once | ORAL | Status: AC
  Administered 2021-01-07: 1000 mg via ORAL

## 2021-01-07 MED ORDER — KETOROLAC TROMETHAMINE 30 MG/ML IJ SOLN
INTRAMUSCULAR | Status: DC | PRN
  Administered 2021-01-07: 30 mg via INTRAVENOUS

## 2021-01-07 MED ORDER — KETOROLAC TROMETHAMINE 30 MG/ML IJ SOLN: 30.0000 mg | Freq: Once | INTRAMUSCULAR | Status: DC | PRN

## 2021-01-07 MED ORDER — OXYCODONE HCL 5 MG PO TABS
ORAL_TABLET | ORAL | Status: AC
  Filled 2021-01-07: qty 1

## 2021-01-07 MED ORDER — MEPERIDINE HCL 25 MG/ML IJ SOLN: 6.2500 mg | INTRAMUSCULAR | Status: DC | PRN

## 2021-01-07 MED ORDER — OXYCODONE HCL 5 MG PO TABS
5.0000 mg | ORAL_TABLET | Freq: Once | ORAL | Status: AC | PRN
  Administered 2021-01-07: 5 mg via ORAL

## 2021-01-07 MED ORDER — MIDAZOLAM HCL 5 MG/5ML IJ SOLN
INTRAMUSCULAR | Status: DC | PRN
  Administered 2021-01-07: 2 mg via INTRAVENOUS

## 2021-01-07 MED ORDER — ACETAMINOPHEN 500 MG PO TABS
ORAL_TABLET | ORAL | Status: AC
  Filled 2021-01-07: qty 2

## 2021-01-07 MED ORDER — HYDROMORPHONE HCL 1 MG/ML IJ SOLN
INTRAMUSCULAR | Status: AC
  Filled 2021-01-07: qty 1

## 2021-01-07 MED ORDER — ROCURONIUM BROMIDE 10 MG/ML (PF) SYRINGE
PREFILLED_SYRINGE | INTRAVENOUS | Status: AC
  Filled 2021-01-07: qty 10

## 2021-01-07 MED ORDER — DEXAMETHASONE SODIUM PHOSPHATE 10 MG/ML IJ SOLN
INTRAMUSCULAR | Status: AC
  Filled 2021-01-07: qty 1

## 2021-01-07 MED ORDER — SUGAMMADEX SODIUM 200 MG/2ML IV SOLN
INTRAVENOUS | Status: DC | PRN
  Administered 2021-01-07: 261.2 mg via INTRAVENOUS

## 2021-01-07 MED ORDER — ACETAMINOPHEN 500 MG PO TABS: 1000.0000 mg | ORAL_TABLET | ORAL | Status: DC

## 2021-01-07 MED ORDER — SCOPOLAMINE 1 MG/3DAYS TD PT72
1.0000 | MEDICATED_PATCH | TRANSDERMAL | Status: DC
  Administered 2021-01-07: 1.5 mg via TRANSDERMAL

## 2021-01-07 MED ORDER — BUPIVACAINE HCL (PF) 0.25 % IJ SOLN
INTRAMUSCULAR | Status: DC | PRN
  Administered 2021-01-07: 6.5 mL

## 2021-01-07 MED ORDER — ONDANSETRON HCL 4 MG/2ML IJ SOLN
INTRAMUSCULAR | Status: DC | PRN
  Administered 2021-01-07: 4 mg via INTRAVENOUS

## 2021-01-07 MED ORDER — IBUPROFEN 200 MG PO TABS: 600.0000 mg | ORAL_TABLET | Freq: Four times a day (QID) | ORAL | Status: AC | PRN

## 2021-01-07 MED ORDER — MIDAZOLAM HCL 2 MG/2ML IJ SOLN
INTRAMUSCULAR | Status: AC
  Filled 2021-01-07: qty 2

## 2021-01-07 MED ORDER — LIDOCAINE 2% (20 MG/ML) 5 ML SYRINGE
INTRAMUSCULAR | Status: AC
  Filled 2021-01-07: qty 5

## 2021-01-07 MED ORDER — OXYCODONE HCL 5 MG/5ML PO SOLN: 5.0000 mg | Freq: Once | ORAL | Status: AC | PRN

## 2021-01-07 SURGICAL SUPPLY — 38 items
ADH SKN CLS APL DERMABOND .7 (GAUZE/BANDAGES/DRESSINGS) ×1
APL SKNCLS STERI-STRIP NONHPOA (GAUZE/BANDAGES/DRESSINGS) ×1
BAG SPEC RTRVL LRG 6X4 10 (ENDOMECHANICALS)
BENZOIN TINCTURE PRP APPL 2/3 (GAUZE/BANDAGES/DRESSINGS) ×2 IMPLANT
BLADE SURG 15 STRL LF DISP TIS (BLADE) ×1 IMPLANT
BLADE SURG 15 STRL SS (BLADE) ×2
BNDG COHESIVE 2X5 TAN NS LF (GAUZE/BANDAGES/DRESSINGS) ×2 IMPLANT
COVER WAND RF STERILE (DRAPES) ×2 IMPLANT
DERMABOND ADVANCED (GAUZE/BANDAGES/DRESSINGS) ×1
DERMABOND ADVANCED .7 DNX12 (GAUZE/BANDAGES/DRESSINGS) ×1 IMPLANT
GLOVE SURG ENC MOIS LTX SZ6 (GLOVE) ×4 IMPLANT
GLOVE SURG UNDER POLY LF SZ6.5 (GLOVE) ×8 IMPLANT
GLOVE SURG UNDER POLY LF SZ7 (GLOVE) ×8 IMPLANT
GOWN STRL REUS W/ TWL LRG LVL3 (GOWN DISPOSABLE) ×1 IMPLANT
GOWN STRL REUS W/TWL LRG LVL3 (GOWN DISPOSABLE) ×10 IMPLANT
KIT TURNOVER CYSTO (KITS) ×2 IMPLANT
LIGASURE VESSEL 5MM BLUNT TIP (ELECTROSURGICAL) IMPLANT
NEEDLE HYPO 25X1 1.5 SAFETY (NEEDLE) ×2 IMPLANT
NEEDLE INSUFFLATION 120MM (ENDOMECHANICALS) ×2 IMPLANT
NS IRRIG 1000ML POUR BTL (IV SOLUTION) IMPLANT
PACK LAPAROSCOPY BASIN (CUSTOM PROCEDURE TRAY) ×2 IMPLANT
PACK TRENDGUARD 450 HYBRID PRO (MISCELLANEOUS) IMPLANT
POUCH SPECIMEN RETRIEVAL 10MM (ENDOMECHANICALS) IMPLANT
PROTECTOR NERVE ULNAR (MISCELLANEOUS) IMPLANT
SET IRRIG TUBING LAPAROSCOPIC (IRRIGATION / IRRIGATOR) ×2 IMPLANT
SET TUBE SMOKE EVAC HIGH FLOW (TUBING) ×2 IMPLANT
SHEET LAVH (DRAPES) ×2 IMPLANT
SPONGE GAUZE 2X2 8PLY STRL LF (GAUZE/BANDAGES/DRESSINGS) ×2 IMPLANT
STRIP CLOSURE SKIN 1/4X3 (GAUZE/BANDAGES/DRESSINGS) ×2 IMPLANT
SUT MNCRL AB 3-0 PS2 27 (SUTURE) ×2 IMPLANT
SUT VICRYL 0 UR6 27IN ABS (SUTURE) IMPLANT
SYR CONTROL 10ML LL (SYRINGE) ×2 IMPLANT
TOWEL OR 17X26 10 PK STRL BLUE (TOWEL DISPOSABLE) ×2 IMPLANT
TRAY FOLEY W/BAG SLVR 14FR (SET/KITS/TRAYS/PACK) ×2 IMPLANT
TRENDGUARD 450 HYBRID PRO PACK (MISCELLANEOUS)
TROCAR XCEL NON-BLD 11X100MML (ENDOMECHANICALS) ×2 IMPLANT
TROCAR XCEL NON-BLD 5MMX100MML (ENDOMECHANICALS) ×6 IMPLANT
WARMER LAPAROSCOPE (MISCELLANEOUS) ×2 IMPLANT

## 2021-01-07 NOTE — Brief Op Note (Signed)
01/07/2021  1:55 PM  PATIENT:  Roberta Nelson  35 y.o. female  PRE-OPERATIVE DIAGNOSIS:  STERILIZATION  POST-OPERATIVE DIAGNOSIS:  STERILIZATION  PROCEDURE: LAPAROSCOPIC BILATERAL SALPINGECTOMY AND REMOVAL OF NEXPLANON  SURGEON:  Surgeon(s) and Role:    * Farhaan Mabee, Terrance Mass, MD - Primary    * Taam-Akelman, Griselda Miner, MD - Assisting   ANESTHESIA:   general  EBL:  10 mL   BLOOD ADMINISTERED:none  DRAINS: none   LOCAL MEDICATIONS USED:  MARCAINE     SPECIMEN:  Source of Specimen:  bilateral fallopian tubes  DISPOSITION OF SPECIMEN:  PATHOLOGY  COUNTS:  YES  TOURNIQUET:  * No tourniquets in log *  DICTATION: .Note written in EPIC  PLAN OF CARE: Discharge to home after PACU  PATIENT DISPOSITION:  PACU - hemodynamically stable.   Delay start of Pharmacological VTE agent (>24hrs) due to surgical blood loss or risk of bleeding: not applicable

## 2021-01-07 NOTE — Anesthesia Postprocedure Evaluation (Signed)
Anesthesia Post Note  Patient: Clarissa Yusuf  Procedure(s) Performed: LAPAROSCOPIC BILATERAL SALPINGECTOMY  **Nexplanon  implant removal LEFT UPPER ARM (Bilateral Abdomen)     Patient location during evaluation: PACU Anesthesia Type: General Level of consciousness: awake and alert, oriented and patient cooperative Pain management: pain level controlled Vital Signs Assessment: post-procedure vital signs reviewed and stable Respiratory status: spontaneous breathing, nonlabored ventilation and respiratory function stable Cardiovascular status: blood pressure returned to baseline and stable Postop Assessment: no apparent nausea or vomiting Anesthetic complications: no   No complications documented.  Last Vitals:  Vitals:   01/07/21 1500 01/07/21 1545  BP: 114/77 115/68  Pulse: (!) 56   Resp: 14 16  Temp:  36.5 C  SpO2: 99% 100%    Last Pain:  Vitals:   01/07/21 1545  TempSrc: Oral  PainSc: 2                  Lannie Fields

## 2021-01-07 NOTE — Op Note (Signed)
OPERATIVE NOTE  PATIENT:  Roberta Nelson  35 y.o. female  PRE-OPERATIVE DIAGNOSIS: DESIRE FOR PERMANENT STERILIZATION, NEXPLANON IN PLACE  POST-OPERATIVE DIAGNOSIS:  DESIRE FOR PERMANENT STERILIZATION, NEXPLANON IN PLACE  PROCEDURE: LAPAROSCOPIC BILATERAL SALPINGECTOMY AND REMOVAL OF NEXPLANON  SURGEON:  Surgeon(s) and Role:    * Emmelyn Schmale, Terrance Mass, MD - Primary    * Taam-Akelman, Griselda Miner, MD - Assisting  ANESTHESIA:   general  EBL:  10 mL   BLOOD ADMINISTERED:none  DRAINS: none   LOCAL MEDICATIONS USED:  MARCAINE     SPECIMEN:  Source of Specimen:  bilateral fallopian tubes  DISPOSITION OF SPECIMEN:  PATHOLOGY  PLAN OF CARE: Discharge to home after PACU  PATIENT DISPOSITION:  PACU - hemodynamically stable.   OPERATIVE FINDINGS: Normal appearing uterus, bilateral fallopian tubes and bilateral ovaries. Normal appearance of appendix and upper abdomen.   OPERATIVE DETAILS:  The patient was appropriately consented and taking to the operating room. Thromboguards were applied to the lower extremities. General anesthesia was obtained without difficulty. The patient was placed in the dorsal lithotomy position in stirrups.   The left upper arm was prepped and a small incision was made over the distal end of the nexplanon implant. The Nexplanon was milked through the incision and removed intact. The Nexplanon was discarded. The incision was infiltrated with 0.25% marcaine. A steri strip was placed over the incision and a pressure dressing was applied.   She was then prepped and draped in normal sterile fashion for the procedure. A speculum was placed into the vagina. The anterior lip of the cervix was grasped with a Forensic scientist. A foley catheter was inserted and draining clear yellow urine.   Attention was turned to the abdominal field. The periumbilical skin was manually elevated, and a 61mm incision was made at the base of the umbilicus. A Veress needle was  inserted through the incision into the abdominal cavity. CO2 gas was connected and initial pressure was , so the Veress needle was removed. After a second attempt, decision was made to enter through Palmer's point. An orogastric tube was placed and a 24mm incision was made in the left upper quadrant. The Veress needle was inserted through this incision and opening pressure was .The pneumoperitoneum was established with CO2 gas to a pressure of 15 mmHg. A 56mm visiport was then inserted through the umbilical incision.   An intraabdominal survey revealed normal-appearing anatomy and lack of any visceral or vascular injury. The Trendelenburg position was obtained to facilitate movement of  the bowel and omentum out of the pelvis. Findings were as noted above.  A 5 mm transverse incision was made in the left lower quadrant after infiltration with 0.25% marcaine. A 82mm trocar was inserted into the abdomen under laparoscopic visualization. The same was repeated in the right lower quadrant. The left fallopian tube was grasped and the Ligasure device was used to serially ligate the mesosalpinx and free the left fallopian tube, which was removed through the laparoscopic port and sent to pathology. The right fallopian tube was then grasped and the Ligasure device was used to serially ligate the mesosalpinx and free the right fallopian tube, which was removed through the laparoscopic port and sent to pathology. Excellent hemostasis was noted.   Pneumoperitoneum was released. All instruments were removed from the abdomen and vagina. Counts were correct times two. The incision sites were closed with 4-0 monocryl and covered with skin glue. The patient was awakened from anesthesia without difficulty. The patient tolerated the  procedure well and was taken to the recovery room in stable condition.  Derl Barrow, MD 01/07/21 2:07 PM

## 2021-01-07 NOTE — Anesthesia Procedure Notes (Signed)
Procedure Name: Intubation Date/Time: 01/07/2021 12:47 PM Performed by: Briant Sites, CRNA Pre-anesthesia Checklist: Patient identified, Emergency Drugs available, Suction available and Patient being monitored Patient Re-evaluated:Patient Re-evaluated prior to induction Oxygen Delivery Method: Circle system utilized Preoxygenation: Pre-oxygenation with 100% oxygen Induction Type: IV induction Ventilation: Mask ventilation without difficulty Laryngoscope Size: 3 Grade View: Grade I Tube type: Oral Tube size: 7.0 mm Number of attempts: 1 Airway Equipment and Method: Stylet and Oral airway Placement Confirmation: ETT inserted through vocal cords under direct vision,  positive ETCO2 and breath sounds checked- equal and bilateral Secured at: 20 cm Tube secured with: Tape Dental Injury: Teeth and Oropharynx as per pre-operative assessment

## 2021-01-07 NOTE — Interval H&P Note (Signed)
History and Physical Interval Note:  01/07/2021 12:19 PM  Roberta Nelson  has presented today for surgery, with the diagnosis of STERILIZATION.  The various methods of treatment have been discussed with the patient and family. After consideration of risks, benefits and other options for treatment, the patient has consented to LAPAROSCOPIC BILATERAL SALPINGECTOMY AND NEXPLANON IMPLANT REMOVAL as a surgical intervention.  The patient's history has been reviewed, patient examined, no change in status, stable for surgery.  I have reviewed the patient's chart and labs.  Questions were answered to the patient's satisfaction.     Charlett Nose

## 2021-01-07 NOTE — Discharge Instructions (Signed)
Salpingectomy, Care After This sheet gives you information about how to care for yourself after your procedure. Your health care provider may also give you more specific instructions. If you have problems or questions, contact your health care provider. What can I expect after the procedure? After the procedure, it is common to have:  Pain in your abdomen.  Some light vaginal bleeding (spotting) for a few days.  Tiredness. Your recovery time will vary depending on which method your surgeon used for your surgery.  Follow these instructions at home: Incision care  Follow instructions from your health care provider about how to take care of your incisions. Make sure you: ? Wash your hands with soap and water before and after you change your bandage (dressing). If soap and water are not available, use hand sanitizer. ? Change or remove your dressing as told by your health care provider. ? Leave any stitches (sutures), skin glue, or adhesive strips in place. These skin closures may need to stay in place for 2 weeks or longer. If adhesive strip edges start to loosen and curl up, you may trim the loose edges. Do not remove adhesive strips completely unless your health care provider tells you to do that.  Keep your dressing clean and dry.  Check your incision area every day for signs of infection. Check for: ? Redness, swelling, or pain that gets worse. ? Fluid or blood. ? Warmth. ? Pus or a bad smell.   Activity  Rest as told by your health care provider.  Avoid sitting for a long time without moving. Get up to take short walks every 1-2 hours. This is important to improve blood flow and breathing. Ask for help if you feel weak or unsteady.  Return to your normal activities as told by your health care provider. Ask your health care provider what activities are safe for you.  Do not drive until your health care provider says that it is safe.  Do not lift anything that is heavier than 10  lb (4.5 kg), or the limit that you are told, until your health care provider says that it is safe. This may be 2-6 weeks depending on your surgery.  Until your health care provider approves: ? Do not douche. ? Do not use tampons. ? Do not have sex. Medicines  Take over-the-counter and prescription medicines only as told by your health care provider.  Ask your health care provider if the medicine prescribed to you: ? Requires you to avoid driving or using heavy machinery. ? Can cause constipation. You may need to take actions to prevent or treat constipation, such as:  Drink enough fluid to keep your urine pale yellow.  Take over-the-counter or prescription medicines.  Eat foods that are high in fiber, such as beans, whole grains, and fresh fruits and vegetables.  Limit foods that are high in fat and processed sugars, such as fried or sweet foods. General instructions  Wear compression stockings as told by your health care provider. These stockings help to prevent blood clots and reduce swelling in your legs.  Do not use any products that contain nicotine or tobacco, such as cigarettes, e-cigarettes, and chewing tobacco. If you need help quitting, ask your health care provider.  Do not take baths, swim, or use a hot tub until your health care provider approves. You may take showers.  Keep all follow-up visits as told by your health care provider. This is important. Contact a health care provider if you have:    Pain when you urinate.  Redness, swelling, or pain around an incision.  Fluid or blood coming from an incision.  Pus or a bad smell coming from an incision.  An incision that feels warm to the touch.  A fever.  Abdominal pain that gets worse or does not get better with medicine.  An incision that starts to break open.  A rash.  Light-headedness.  Nausea and vomiting. Get help right away if you:  Have pain in your chest or leg.  Develop shortness of  breath.  Faint.  Have increased or heavy vaginal bleeding, such as soaking a pad in an hour. Summary  After the procedure, it is common to feel tired, have some pain in your abdomen, and have some light vaginal bleeding for a few days.  Follow instructions from your health care provider about how to take care of your incisions.  Return to your normal activities as told by your health care provider. Ask your health care provider what activities are safe for you.  Do not douche, use tampons, or have sex until your health care provider approves.  Keep all follow-up visits as told by your health care provider. This information is not intended to replace advice given to you by your health care provider. Make sure you discuss any questions you have with your health care provider. Document Revised: 08/22/2018 Document Reviewed: 08/22/2018 Elsevier Patient Education  2021 [employer]   Post Anesthesia Home Care Instructions  Activity: Get plenty of rest for the remainder of the day. A responsible individual must stay with you for 24 hours following the procedure.  For the next 24 hours, DO NOT: -Drive a car -Operate machinery -Drink alcoholic beverages -Take any medication unless instructed by your physician -Make any legal decisions or sign important papers.  Meals: Start with liquid foods such as gelatin or soup. Progress to regular foods as tolerated. Avoid greasy, spicy, heavy foods. If nausea and/or vomiting occur, drink only clear liquids until the nausea and/or vomiting subsides. Call your physician if vomiting continues.  Special Instructions/Symptoms: Your throat may feel dry or sore from the anesthesia or the breathing tube placed in your throat during surgery. If this causes discomfort, gargle with warm salt water. The discomfort should disappear within 24 hours.  If you had a scopolamine patch placed behind your ear for the management of post- operative nausea and/or  vomiting:  1. The medication in the patch is effective for 72 hours, after which it should be removed.  Wrap patch in a tissue and discard in the trash. Wash hands thoroughly with soap and water. 2. You may remove the patch earlier than 72 hours if you experience unpleasant side effects which may include dry mouth, dizziness or visual disturbances. 3. Avoid touching the patch. Wash your hands with soap and water after contact with the patch.      

## 2021-01-07 NOTE — H&P (Signed)
Roberta Nelson is an 35 y.o. female. P2 who desires permanent sterilization by bilateral salpingectomy.   Nexplanon since April 2019, due for removal  SVD in 2019 and 2012   Past Medical History:  Diagnosis Date  . Bell's palsy 2021   left side of face, lips numb  . Headache    tension  . Medical history non-contributory     Past Surgical History:  Procedure Laterality Date  . nexplanon     inserted 2019 per patient  . NO PAST SURGERIES      Family History  Problem Relation Age of Onset  . Cancer Mother        endometrial  . Diabetes Maternal Grandmother   . Bell's palsy Neg Hx     Social History:  reports that she has never smoked. She has never used smokeless tobacco. She reports that she does not drink alcohol and does not use drugs.  Allergies: No Known Allergies  No medications prior to admission.    Review of Systems As noted in HPI Height 4\' 9"  (1.448 m), weight 63.5 kg, last menstrual period 12/26/2020. Physical Exam 3/25 office visit Chaperone Chaperone: present  Constitutional *General Appearance: healthy-appearing, well-nourished, well-developed  Head Head: normocephalic  Neck *Thyroid: no enlargement, no nodules, non-tender  Lymph Nodes *Palpation: non-tender supraclavicular nodes, non-tender axillary nodes, non-tender inguinal nodes  Cardiovascular *Auscultation: RRR, no murmur  Lungs *Respiratory Effort: no accessory muscle usage, no intercostal retractions *Auscultation: clear to auscultation, no wheezing, no rales/crackles, no rhonchi  *Breast Bilateral: no skin changes, nipple appearance: normal, no abnormal nipple secretions, no tenderness, no masses palpable  Abdomen *Inspection/Palpation/Auscultation: non-distended, no tenderness, no rebound, no guarding, soft  Female Genitalia Vulva: no masses, no atrophy, no lesions Mons: normal Labia Majora: normal, no erythema, no excoriation, no atrophy, no discoloration, no  lesions Labia Minora: normal, no erythema, no excoriation, no atrophy, no discoloration, no lesions Introitus: normal *Vagina: normal, no discharge, no blood present, no erythema, no atrophy, no lesions, no ulcers, no masses, no tenderness *Cervix: grossly normal, no lesions, no discharge, no bleeding, no cervical motion tenderness *Uterus: normal size, normal contour, midline, mobile, non-tender, anteverted *Urethral Meatus/ Urethra: normal meatus *Adnexa/Parametria: no mass palpable, no tenderness  Extremities Legs: no calf tenderness Arms: sub-dermal implant palpated: left  Neurological System Impressions: motor: no deficits, sensory: no deficits  Psychiatric *Orientation: to person, to place, to time *Mood and Affect: active and alert, normal mood, normal affect Procedure Documentation   Assessment/Plan: 4/25 with desire for permanent sterilization and Nexplanon in place, admit for planned procedure: laparoscopic bilateral salpingectomy and removal of Nexplanon. - NPO  - SCDs - Foley - No antibiotics indicated  02B [id number] 01/07/2021, 8:59 AM

## 2021-01-07 NOTE — Transfer of Care (Signed)
Immediate Anesthesia Transfer of Care Note  Patient: Roberta Nelson  Procedure(s) Performed: LAPAROSCOPIC BILATERAL SALPINGECTOMY  **Nexplanon  implant removal LEFT UPPER ARM (Bilateral Abdomen)  Patient Location: PACU  Anesthesia Type:General  Level of Consciousness: drowsy  Airway & Oxygen Therapy: Patient Spontanous Breathing and Patient connected to nasal cannula oxygen  Post-op Assessment: Report given to RN  Post vital signs: Reviewed and stable  Last Vitals:  Vitals Value Taken Time  BP 109/65 01/07/21 1409  Temp    Pulse 78 01/07/21 1410  Resp 16 01/07/21 1410  SpO2 100 % 01/07/21 1410  Vitals shown include unvalidated device data.  Last Pain:  Vitals:   01/07/21 1035  TempSrc: Oral         Complications: No complications documented.

## 2021-01-07 NOTE — Anesthesia Preprocedure Evaluation (Addendum)
Anesthesia Evaluation  Patient identified by MRN, date of birth, ID band Patient awake    Reviewed: Allergy & Precautions, NPO status , Patient's Chart, lab work & pertinent test results  Airway Mallampati: III  TM Distance: >3 FB Neck ROM: Full    Dental no notable dental hx. (+) Teeth Intact, Dental Advisory Given   Pulmonary neg pulmonary ROS,    Pulmonary exam normal breath sounds clear to auscultation       Cardiovascular negative cardio ROS Normal cardiovascular exam Rhythm:Regular Rate:Normal     Neuro/Psych  Headaches,  Neuromuscular disease (bell's palsy 2021 L side) negative psych ROS   GI/Hepatic negative GI ROS, Neg liver ROS,   Endo/Other  negative endocrine ROS  Renal/GU negative Renal ROS  negative genitourinary   Musculoskeletal negative musculoskeletal ROS (+)   Abdominal   Peds  Hematology negative hematology ROS (+)   Anesthesia Other Findings   Reproductive/Obstetrics Desires sterility                             Anesthesia Physical Anesthesia Plan  ASA: I  Anesthesia Plan: General   Post-op Pain Management:    Induction: Intravenous  PONV Risk Score and Plan: 4 or greater and Ondansetron, Dexamethasone, Midazolam, Scopolamine patch - Pre-op and Treatment may vary due to age or medical condition  Airway Management Planned: Oral ETT  Additional Equipment: None  Intra-op Plan:   Post-operative Plan: Extubation in OR  Informed Consent: I have reviewed the patients History and Physical, chart, labs and discussed the procedure including the risks, benefits and alternatives for the proposed anesthesia with the patient or authorized representative who has indicated his/her understanding and acceptance.     Dental advisory given  Plan Discussed with: CRNA  Anesthesia Plan Comments:         Anesthesia Quick Evaluation

## 2021-01-08 LAB — SURGICAL PATHOLOGY

## 2021-01-09 ENCOUNTER — Encounter (HOSPITAL_BASED_OUTPATIENT_CLINIC_OR_DEPARTMENT_OTHER): Payer: Self-pay | Admitting: Obstetrics and Gynecology
